# Patient Record
Sex: Female | Born: 1945 | Race: White | Hispanic: No
Health system: Midwestern US, Community
[De-identification: ages and names within clinical notes are randomized; demographics above are authoritative.]

## PROBLEM LIST (undated history)

## (undated) DIAGNOSIS — I2510 Atherosclerotic heart disease of native coronary artery without angina pectoris: Secondary | ICD-10-CM

## (undated) DIAGNOSIS — I6523 Occlusion and stenosis of bilateral carotid arteries: Secondary | ICD-10-CM

## (undated) DIAGNOSIS — E7800 Pure hypercholesterolemia, unspecified: Secondary | ICD-10-CM

---

## 2013-05-02 LAB — CBC WITH AUTOMATED DIFF
ABS. BASOPHILS: 0 10*3/uL (ref 0.0–0.1)
ABS. EOSINOPHILS: 0.1 10*3/uL (ref 0.0–0.7)
ABS. LYMPHOCYTES: 2 10*3/uL (ref 1.5–3.5)
ABS. MONOCYTES: 0.8 10*3/uL (ref 0.0–1.0)
ABS. NEUTROPHILS: 7.3 10*3/uL — ABNORMAL HIGH (ref 1.5–6.6)
BASOPHILS: 0 % (ref 0.0–3.0)
EOSINOPHILS: 1 % (ref 0.0–7.0)
HCT: 43.5 % (ref 36.0–47.0)
HGB: 14.2 g/dL (ref 12.0–16.0)
IMMATURE GRANULOCYTES: 0.3 % (ref 0.0–2.0)
LYMPHOCYTES: 19 % (ref 18.0–40.0)
MCH: 28.3 PG (ref 27.0–35.0)
MCHC: 32.6 g/dL (ref 30.7–37.3)
MCV: 86.7 FL (ref 81.0–94.0)
MONOCYTES: 8 % (ref 2.0–12.0)
MPV: 10.2 FL (ref 9.2–11.8)
NEUTROPHILS: 72 % (ref 48.0–72.0)
PLATELET: 207 10*3/uL (ref 130–400)
RBC: 5.02 M/uL (ref 4.20–5.40)
RDW: 14.1 % — ABNORMAL HIGH (ref 11.5–14.0)
WBC: 10.3 10*3/uL (ref 4.8–10.6)

## 2013-05-02 LAB — URINALYSIS W/ RFLX MICROSCOPIC
Bilirubin: NEGATIVE
Glucose: NEGATIVE mg/dL
Nitrites: NEGATIVE
Protein: NEGATIVE mg/dL
Specific gravity: 1.009 (ref 1.003–1.030)
Urobilinogen: 0.2 EU/dL (ref 0.2–1.0)
pH (UA): 7 (ref 4.6–8.0)

## 2013-05-02 LAB — METABOLIC PANEL, COMPREHENSIVE
A-G Ratio: 1.1 (ref 0.7–2.8)
ALT (SGPT): 31 U/L (ref 12–78)
AST (SGOT): 11 U/L — ABNORMAL LOW (ref 15–37)
Albumin: 3.6 g/dL (ref 3.5–4.7)
Alk. phosphatase: 90 U/L (ref 45–117)
Anion gap: 8 mmol/L — ABNORMAL LOW (ref 10–20)
BUN: 22 mg/dL — ABNORMAL HIGH (ref 7–18)
Bilirubin, total: 0.5 mg/dL (ref 0.2–1.0)
CO2: 33 mmol/L — ABNORMAL HIGH (ref 21–32)
Calcium: 9.2 mg/dL (ref 8.5–10.1)
Chloride: 101 mmol/L (ref 98–107)
Creatinine: 0.9 mg/dL (ref 0.6–1.3)
GFR est AA: >60 mL/min/{1.73_m2} (ref >60)
GFR est non-AA: >60 mL/min/{1.73_m2} (ref >60)
Globulin: 3.3 g/dL (ref 1.7–4.7)
Glucose: 98 mg/dL (ref 74–106)
Potassium: 3.4 mmol/L — ABNORMAL LOW (ref 3.5–5.1)
Protein, total: 6.9 g/dL (ref 6.4–8.2)
Sodium: 139 mmol/L (ref 136–145)

## 2013-05-02 LAB — LIPASE: Lipase: 167 U/L (ref 73–393)

## 2013-05-02 MED ORDER — KETOROLAC TROMETHAMINE 30 MG/ML INJECTION
30 mg/mL (1 mL) | INTRAMUSCULAR | Status: AC
Start: 2013-05-02 — End: 2013-05-02
  Administered 2013-05-02: 10:00:00 via INTRAVENOUS

## 2013-05-02 MED ORDER — MORPHINE 4 MG/ML SYRINGE
4 mg/mL | Freq: Once | INTRAMUSCULAR | Status: DC
Start: 2013-05-02 — End: 2013-05-02

## 2013-05-02 MED ORDER — KETOROLAC TROMETHAMINE 30 MG/ML INJECTION
30 mg/mL (1 mL) | INTRAMUSCULAR | Status: AC
Start: 2013-05-02 — End: 2013-05-02
  Administered 2013-05-02: 11:00:00 via INTRAVENOUS

## 2013-05-02 MED ORDER — ONDANSETRON (PF) 4 MG/2 ML INJECTION
4 mg/2 mL | Freq: Once | INTRAMUSCULAR | Status: AC
Start: 2013-05-02 — End: 2013-05-02
  Administered 2013-05-02: 10:00:00 via INTRAVENOUS

## 2013-05-02 MED ADMIN — sodium chloride 0.9 % bolus infusion 1,000 mL: INTRAVENOUS | @ 10:00:00 | NDC 00409798309

## 2013-05-02 NOTE — ED Notes (Signed)
Pt. Ambulatory from ED accompanied by spouse - pt. Provided with printed discharge instrucitions and prescriptions.  Pt. Walks with steady gait - NAD.

## 2013-05-02 NOTE — ED Notes (Signed)
Pt. C/o cont. Pain - PA made aware and ordered Morphine - pt. Refuse Morphine at this time.  Will notify PA.

## 2013-05-02 NOTE — ED Notes (Signed)
Pt. Resting quietly on stretcher w/ c/o R flank pain that began yesterday.

## 2013-05-02 NOTE — ED Notes (Signed)
C/o left sided abdominal and flank pain.  States pain started about 36 hours ago while she was in [place].  States was seen in ED there and had CT scan, but she states that they were unsure if she has a kidney stone or what.  States drove for the past 17 hours to get here to be evaluated.  States also has not had bowel movement in over 3 days as well.

## 2013-05-02 NOTE — ED Provider Notes (Addendum)
HPI Comments: 5:17 AM  Chief complaint: left flank pain, nausea  History obtained from patient and spouse  Anita Robbins is a 67 y.o. female who presents to the Emergency Dept with c/o left flank pain for past 3 days. [place] and was seen in an ED there, where she was evaluated, but no cause was found for her pain. She had CT abd/pelv with IV contrast and labs at that time - was discharged with rx percocet. Pt drove back up to [place] and continues to have pain located in left flank region, radiating to left groin region. C/o N/V that only occurred after she took the percocet. Has not had a BM in 3 days. Denies c/o fever, chills, cough, congestion, vaginal discharge, dysuria. Wants to know what is causing her pain.     PCP: Lise Auer, MD   No LMP recorded. Patient is postmenopausal.        History reviewed. No pertinent past medical history.     History reviewed. No pertinent past surgical history.      History reviewed. No pertinent family history.     History     Social History   ??? Marital Status: WIDOWED     Spouse Name: N/A     Number of Children: N/A   ??? Years of Education: N/A     Occupational History   ??? Not on file.     Social History Main Topics   ??? Smoking status: Never Smoker    ??? Smokeless tobacco: Not on file   ??? Alcohol Use: Not on file   ??? Drug Use: Not on file   ??? Sexually Active: Not on file     Other Topics Concern   ??? Not on file     Social History Narrative   ??? No narrative on file          ALLERGIES: Codeine      Review of Systems   Constitutional: Negative for fever and chills.   HENT: Negative for neck pain and neck stiffness.    Respiratory: Negative for cough and shortness of breath.    Cardiovascular: Negative for chest pain.   Gastrointestinal: Positive for abdominal pain and constipation. Negative for diarrhea.   Genitourinary: Positive for flank pain. Negative for dysuria, frequency and vaginal discharge.   Musculoskeletal: Negative for back  pain.   Skin: Negative for rash.   Neurological: Negative for dizziness and headaches.       Filed Vitals:    05/02/13 0301   BP: 174/105   Temp: 98.2 ??F (36.8 ??C)   Resp: 20   Height: 5\' 6"  (1.676 m)   Weight: 72.576 kg (160 lb)   SpO2: 96%            Physical Exam   Nursing note and vitals reviewed.  Constitutional: She is oriented to person, place, and time. Vital signs are normal. She appears well-developed and well-nourished.  Non-toxic appearance. No distress.   HENT:   Head: Normocephalic and atraumatic.   Mouth/Throat: Oropharynx is clear and moist and mucous membranes are normal.   Eyes: Conjunctivae are normal.   Neck: Normal range of motion. Neck supple.   Cardiovascular: Normal rate and regular rhythm.    Pulmonary/Chest: Effort normal and breath sounds normal.   Abdominal: Soft. She exhibits no distension. There is tenderness (mild) in the left lower quadrant. There is CVA tenderness (mild, left side). There is no rebound and no  guarding.   Neurological: She is alert and oriented to person, place, and time. GCS eye subscore is 4. GCS verbal subscore is 5. GCS motor subscore is 6.   Skin: Skin is warm and dry. She is not diaphoretic.   Psychiatric:   Calm and cooperative         MDM     Amount and/or Complexity of Data Reviewed:   Clinical lab tests:  Ordered and reviewed  Tests in the radiology section of CPT??:  Ordered and reviewed   Obtain history from someone other than the patient:  Yes   Review and summarize past medical records:  Yes   Discuss the patient with another provider:  Yes   Independant visualization of image, tracing, or specimen:  Yes      Procedures      Labs:  Recent Results (from the past 12 hour(s))   METABOLIC PANEL, COMPREHENSIVE    Collection Time     05/02/13  5:20 AM       Result Value Range    Sodium 139  136 - 145 mmol/L    Potassium 3.4 (*) 3.5 - 5.1 mmol/L    Chloride 101  98 - 107 mmol/L    CO2 33 (*) 21 - 32 mmol/L    Anion gap 8 (*) 10 - 20 mmol/L    Glucose 98  74 -  106 mg/dL    BUN 22 (*) 7 - 18 mg/dL    Creatinine 0.9  0.6 - 1.3 mg/dL    GFR est AA >16  >10 ml/min/1.15m2    GFR est non-AA >60  >60 ml/min/1.73m2    Calcium 9.2  8.5 - 10.1 mg/dL    Bilirubin, total 0.5  0.2 - 1.0 mg/dL    ALT 31  12 - 78 U/L    AST 11 (*) 15 - 37 U/L    Alk. phosphatase 90  45 - 117 U/L    Protein, total 6.9  6.4 - 8.2 g/dL    Albumin 3.6  3.5 - 4.7 g/dL    Globulin 3.3  1.7 - 4.7 g/dL    A-G Ratio 1.1  0.7 - 2.8     LIPASE    Collection Time     05/02/13  5:20 AM       Result Value Range    Lipase 167  73 - 393 U/L   CBC WITH AUTOMATED DIFF    Collection Time     05/02/13  5:20 AM       Result Value Range    WBC 10.3  4.8 - 10.6 K/uL    RBC 5.02  4.20 - 5.40 M/uL    HGB 14.2  12.0 - 16.0 g/dL    HCT 96.0  45.4 - 09.8 %    MCV 86.7  81.0 - 94.0 FL    MCH 28.3  27.0 - 35.0 PG    MCHC 32.6  30.7 - 37.3 g/dL    RDW 11.9 (*) 14.7 - 14.0 %    PLATELET 207  130 - 400 K/uL    MPV 10.2  9.2 - 11.8 FL    NEUTROPHILS 72  48.0 - 72.0 %    LYMPHOCYTES 19  18.0 - 40.0 %    MONOCYTES 8  2.0 - 12.0 %    EOSINOPHILS 1  0.0 - 7.0 %    BASOPHILS 0  0.0 - 3.0 %    ABS. NEUTROPHILS 7.3 (*) 1.5 - 6.6 K/UL  ABS. LYMPHOCYTES 2.0  1.5 - 3.5 K/UL    ABS. MONOCYTES 0.8  0.0 - 1.0 K/UL    ABS. EOSINOPHILS 0.1  0.0 - 0.7 K/UL    ABS. BASOPHILS 0.0  0.0 - 0.1 K/UL    DF AUTOMATED      IMMATURE GRANULOCYTES 0.3  0.0 - 2.0 %   URINALYSIS W/ RFLX MICROSCOPIC    Collection Time     05/02/13  6:52 AM       Result Value Range    Color YELLOW  YEL      Appearance CLEAR  CLEAR      Specific gravity 1.009  1.003 - 1.030      pH (UA) 7.0  4.6 - 8.0      Protein NEGATIVE   NEG mg/dL    Glucose NEGATIVE   NEG mg/dL    Ketone TRACE (*) NEG mg/dL    Bilirubin NEGATIVE   NEG      Blood TRACE (*) NEG      Urobilinogen 0.2  0.2 - 1.0 EU/dL    Nitrites NEGATIVE   NEG      Leukocyte Esterase TRACE (*) NEG      WBC 0-3  0 - 5 /hpf    RBC 0-3  0 - 3 /hpf    Epithelial cells 0-3  0 - 10 /hpf    Bacteria NONE  NONE /hpf    Casts NONE  NONE  /lpf    Crystals NONE  NONE /LPF         Radiology:  Referring Physician: Norwood Levo [place]  Patient Name: [name]  THIS IS A PRELIMINARYREPORT FROM IMAGING ON CALL   EXAM: CT abdomen and pelvis without contrast   FINDINGS:   Lung bases are clear. The visualized cardiac chambers are normal size and   configuration. Gallbladder is distended and contains cholesterol stones but is   not inflamed. 2.4 cm benign right adrenal adenoma is noted. Subtle curvilinear   density lower pole left kidney likely this is a partially calcified cyst which   can be followed up with enhanced CT as clinically warranted. Normal liver,   pancreas, spleen, left adrenal gland and right kidney. The stomach and abdominal   small and large bowel are normal. There is no aortic aneurysm. There is no   significant retroperitoneal lymphadenopathy. Small fat-containing umbilical   hernia is noted.  The pelvic small and large bowel are notable only for mild diverticulosis. There   is no evidence of appendicitis. The uterus and adnexal structures are normal.   Urinary bladder is unremarkable. There is no pelvic free fluid. No discrete   pelvic lymphadenopathy is identified.   IMPRESSION:   Gallstones without gallbladder inflammation.  Right adrenal adenoma.  Partially calcified left renal cyst may be followed up with enhanced CT as   clinically warranted.  No definite evidence of acute pathology.  THIS DOCUMENT HAS BEEN ELECTRONICALLY SIGNED  Hyman Bible, MD  05/02/2013 05:53 EST     <EMERGENCY DEPARTMENT CASE SUMMARY>    Impression/Differential Diagnosis: kidney stone, pyelonephritis, flank pain, constipation    Plan: CT abd/pelv (-), labs, UA, zofran, toradol    ED Course: Pt is a well appearing 67 y/o female who presents to the ED c/o left flank pain for past 3 days. Workup at previous hospital did not reveal cause for acute pain. Reviewed reports of imaging and labs that pt provided from previous hospital. Decision was made to repeat  CT abd/pelv  without contrast to see if any small ureteral calculi were present to account for patient's pain (previous CT was performed with IV contrast). UA and labs reviewed. CT abd/pelv interpreted as negative for acute pathology. Several incidental findings noted, which were discussed with pt and spouse, including diverticulosis, right adrenal adenoma, gallstones, and left renal calcified cyst. Pt stable for discharge, to follow up with PCP. Advised to try magnesium citrate and miralax in an attempt to relieve constipation. No emesis while in the ED - pt was provided with Rx zofran for home.     D/w patient and spouse test results, dx, tx plan, and need for follow-up. Pt advised to RTER for new or worsening symptoms. The patient and spouse verbalized understanding. All questions answered, and the patient and spouse agrees with the plan.    Final Impression/Diagnosis:   1. Left flank pain    2. Constipation    3. Renal cyst     Patient condition at time of disposition:  stable     I have reviewed the following home medications:    Prior to Admission medications    Medication Sig Start Date End Date Taking? Authorizing Provider   oxyCODONE-acetaminophen (PERCOCET) 5-325 mg per tablet Take 1 tablet by mouth every four (4) hours as needed for Pain.   Yes Phys Other, MD       Marena Chancy, NP    I was personally available for consultation in the emergency department.  I have reviewed the chart and the documentation recorded by the [facility], including the assessment, treatment plan, and disposition.  Jacklynn Barnacle, MD

## 2013-05-03 LAB — CULTURE, URINE
Culture result:: NO GROWTH
Culture: NO GROWTH

## 2013-05-03 NOTE — Progress Notes (Signed)
Quick Note:    Pt was made aware of incidental findings (renal cyst, adrenal adenoma, gallstone) prior to discharge and instructed to follow up with PCP.  ______

## 2015-07-09 ENCOUNTER — Ambulatory Visit
Admit: 2015-07-09 | Discharge: 2015-07-09 | Payer: MEDICARE | Attending: Cardiovascular Disease | Primary: Internal Medicine

## 2015-07-09 DIAGNOSIS — I10 Essential (primary) hypertension: Secondary | ICD-10-CM

## 2015-07-09 MED ORDER — VALSARTAN 80 MG TAB
80 mg | ORAL_TABLET | Freq: Every day | ORAL | 1 refills | Status: DC
Start: 2015-07-09 — End: 2015-09-04

## 2015-07-09 NOTE — Patient Instructions (Signed)
MyChart Activation    Thank you for requesting access to MyChart. Please follow the instructions below to securely access and download your online medical record. MyChart allows you to send messages to your doctor, view your test results, renew your prescriptions, schedule appointments, and more.    How Do I Sign Up?    1. In your internet browser, go to [email or web address]  2. Click on the First Time User? Click Here link in the Sign In box. You will be redirect to the New Member Sign Up page.  3. Enter your MyChart Access Code exactly as it appears below. You will not need to use this code after you???ve completed the sign-up process. If you do not sign up before the expiration date, you must request a new code.    MyChart Access Code: [email or web address]  Expires: 10/07/2015  2:06 PM (This is the date your MyChart access code will expire)    4. Enter the last four digits of your Social Security Number (xxxx) and Date of Birth (mm/dd/yyyy) as indicated and click Submit. You will be taken to the next sign-up page.  5. Create a MyChart ID. This will be your MyChart login ID and cannot be changed, so think of one that is secure and easy to remember.  6. Create a MyChart password. You can change your password at any time.  7. Enter your Password Reset Question and Answer. This can be used at a later time if you forget your password.   8. Enter your e-mail address. You will receive e-mail notification when new information is available in MyChart.  9. Click Sign Up. You can now view and download portions of your medical record.  10. Click the Download Summary menu link to download a portable copy of your medical information.    Additional Information    If you have questions, please visit the Frequently Asked Questions section of the MyChart website at [email or web address] Remember, MyChart is NOT to be used for urgent needs. For medical emergencies, dial 911.

## 2015-07-09 NOTE — Progress Notes (Signed)
07/09/2015    Georg Ruddle, MD    RE: [name]     Dear Georg Ruddle, MD    I had the pleasure of seeing your patient Anita Robbins in the office today. Although you are familiar with her past medical history, I shall review it here for my records.    HISTORY OF PRESENT ILLNESS: Anita Robbins is a 70 y.o. female who comes to the office today for had concerns including Heart Problem. patient is referred for evaluation of elevated blood pressure, noted to be erratic as per the patient for many years (but not on any therapy at this time).  She also had recent extensive workup done by her PMD which included an MRI of abdomen 06/10/15 which showed a stable 2.4 cm right adrenal adenoma.  CT chest was also completed 07/01/15 (2 workup 50-pack-year history of smoking) and did confirm a 1.7 cm right upper lobe groundglass nodule.  Despite the above findings she is presently asymptomatic, with no complaints of chest pain shortness of breath palpitations vomiting fever chills headache syncope GI bleed or hematuria.  Baseline 2+ orthopnea, no PND. She has had no previous stress test or echocardiogram performed.  Of note, patient has office visit with Dr Arman Bogus later this week to evaluate the right lung nodule seen on CT chest.      CURRENT MEDICATIONS:    Current Outpatient Prescriptions   Medication Sig   ??? omeprazole (PRILOSEC) 20 mg capsule Take 20 mg by mouth daily.   ??? cholecalciferol, vitamin D3, (VITAMIN D3) 2,000 unit tab Take 2,000 Int'l Units by mouth daily.     No current facility-administered medications for this visit.         ALLERGIES: Patient is allergic to codeine.    REVIEW OF SYSTEM:   Review of Systems   Constitutional: Negative for chills and fever.   Eyes: Negative for double vision.   Respiratory: Negative for cough, hemoptysis and shortness of breath.    Cardiovascular: Negative for chest pain, palpitations, leg swelling and PND.    Gastrointestinal: Negative for blood in stool, melena, nausea and vomiting.   Genitourinary: Negative for hematuria.   Musculoskeletal: Negative for joint pain.   Skin: Negative for rash.   Neurological: Negative for seizures, loss of consciousness and headaches.   Endo/Heme/Allergies: Does not bruise/bleed easily.         FAMILY HISTORY: Patient family history is negative for Coronary Artery Disease and Stroke.    SOCIAL HISTORY: Patient  reports that she quit smoking about 7 years ago. She has a 50.00 pack-year smoking history. She has never used smokeless tobacco.    Surgical History:  Patient   Past Surgical History:   Procedure Laterality Date   ??? HX BREAST LUMPECTOMY Left    ??? HX OVARIAN CYST REMOVAL Left         PHYSICAL EXAMINATION: Patient  height is  (1.676 m) and weight is 172 lb (78 kg). Her blood pressure is 180/100 (abnormal) and her pulse is 88. Her oxygen saturation is 95%.    BP 190/100    Physical Exam   Constitutional: She is well-developed, well-nourished, and in no distress.   HENT:   Head: Normocephalic and atraumatic.   Eyes: No scleral icterus.   Neck: No JVD present. Carotid bruit is present. No thyromegaly present.       Cardiovascular: Normal rate, regular rhythm and intact distal pulses.  Exam reveals no gallop and no friction rub.  Murmur heard.  Systolic ejection murmur at right upper sternal border, no S3 or S4   Pulmonary/Chest: Breath sounds normal. She has no wheezes. She has no rales.   Abdominal: Soft. Bowel sounds are normal. She exhibits no distension. There is no tenderness.   Musculoskeletal: She exhibits no edema.   Neurological: She is alert. No cranial nerve deficit.   Skin: Skin is warm and dry.                         LABS & TESTING:   EKG: Sinus  Rhythm 80/min  WITHIN NORMAL LIMITS    Labs 06/20/15 = blood sugar 107 electrolytes CBC and TFTs WNL.  TC 182 TG 167 HDL 49 LDL 100.    Patient Active Problem List    Diagnosis   ??? Gallstones      Seen on CT chest 07/01/15 and MRI abdomen 06/10/15     ??? Nodule of right lung     CT chest 07/01/15 = 1.7 cm groundglass right upper lobe nodule, 3 mm right upper lobe subpleural nodule, mild paraseptal emphysematous change, gallstones in a contracted gallbladder  [place]     ??? COPD (chronic obstructive pulmonary disease) (HCC)     CT chest 07/01/15 = mild paraseptal emphysematous change  PFTs 06/27/15 = normal spirometry     ??? ASHD (arteriosclerotic heart disease)     CT chest 07/01/15 = mild coronary artery calcification     ??? Barrett's esophagus     Followed by Dr Delfino LovettVipul Shah     ??? Shingles   ??? Adrenal adenoma     MRI abdomen 06/10/15 = stable 2.4 cm right adrenal adenoma (compared to study 2016), fatty liver, several prominent gallstones within the gallbladder, stable pancreatic cystic lesions     ??? Hypertension        ASSESSMENT AND PLAN:    1. Patient presents to the office for baseline cardiac evaluation given risk factors above and coronary calcification seen on CT chest 2/17. Recommend cardiac workup despite present asymptomatic status (note: may also need eventual thoracic surgery for 1.7 cm RUL nodule).  2. Continue same meds, add ASA 81 mg daily (will hold if surgery required). Recommend strict low salt/chol diet.  3. Start Diovan 80 mg daily for elevated BP response.  4. Schedule echo, carotid US and EST MIBI (evaluate CAD/bruits/murmur).  5. Thoracic evaluation with Dr [place]an [place] later this week.  6. Routine cardiac follow up 1 month. Will clear for lung nodule resection as low risk pending completion of above testing.    Orders Placed This Encounter   ??? AMB POC EKG ROUTINE W/ 12 LEADS, INTER & REP         I discussed the patient's  BMI with her.  I have recommended the following interventions: dietary management education, guidance, and counseling .     The patient was counseled on the dangers of tobacco use.      I attest that current meds have been reviewed and are accurate     Thank you for the courtesy of this consultation.    Sincerely yours,    Alessandra BevelsLee P Luevenia Mcavoy, MD

## 2015-07-15 ENCOUNTER — Encounter: Primary: Internal Medicine

## 2015-07-17 ENCOUNTER — Encounter: Payer: MEDICARE | Primary: Internal Medicine

## 2015-07-18 ENCOUNTER — Encounter: Payer: MEDICARE | Primary: Internal Medicine

## 2015-07-18 ENCOUNTER — Institutional Professional Consult (permissible substitution): Admit: 2015-07-18 | Discharge: 2015-07-18 | Payer: MEDICARE | Primary: Internal Medicine

## 2015-07-18 DIAGNOSIS — I10 Essential (primary) hypertension: Secondary | ICD-10-CM

## 2015-07-18 NOTE — Progress Notes (Signed)
??   BP stable on Diovan, continue present therapy

## 2015-07-18 NOTE — Patient Instructions (Signed)
BP stable on Diovan, continue present therapy.  Spoke with patient

## 2015-07-22 ENCOUNTER — Encounter: Payer: MEDICARE | Primary: Internal Medicine

## 2015-07-25 NOTE — Progress Notes (Signed)
Discussed with patient results of carotid ultrasound showed mild to moderate plaque with no significant stenosis.  Continue aspirin.  LDL goal less than 100. currently 100 mg/dL.  Echocardiogram shows mild AI with normal LV function.  Continue present care with repeat studies 1-2 years.

## 2015-08-20 ENCOUNTER — Encounter: Payer: MEDICARE | Primary: Internal Medicine

## 2015-09-04 ENCOUNTER — Ambulatory Visit
Admit: 2015-09-04 | Discharge: 2015-09-04 | Payer: MEDICARE | Attending: Cardiovascular Disease | Primary: Internal Medicine

## 2015-09-04 DIAGNOSIS — I10 Essential (primary) hypertension: Secondary | ICD-10-CM

## 2015-09-04 MED ORDER — VALSARTAN 160 MG TAB
160 mg | ORAL_TABLET | Freq: Every day | ORAL | 3 refills | Status: DC
Start: 2015-09-04 — End: 2016-09-07

## 2015-09-04 NOTE — Progress Notes (Signed)
09/04/2015    CHIEF COMPLAINT: had concerns including Heart Problem.   Subjective:   HISTORY OF PRESENT ILLNESS: Anita Robbins is a 70 y.o.female who presents for cardiology follow up.  Patient is doing very well, with no complaints of chest pain shortness of breath palpitations vomiting fever chills headache syncope GI bleed or hematuria. Baseline 1+ orthopnea, no PND.  Baseline cardiac testing includes echo 07/18/15 which shows RVSP 30 mmHg, mild AI, normal LV function.  EST MIBI 08/20/15 confirms no ischemia with EF 74%.  Carotid 07/22/15 = mild to moderate plaque both carotid bulbs without stenosis.  The patient is also waiting a PET scan later this month to reevaluate the lung nodule seen on prior CT chest 2/17.  Of note, patient did have an elevated blood pressure of 220/100 at peak exercise during her stress test.      Allergies   Allergen Reactions   ??? Codeine Nausea Only     Current Outpatient Prescriptions   Medication Sig   ??? omeprazole (PRILOSEC) 20 mg capsule Take 20 mg by mouth daily.   ??? cholecalciferol, vitamin D3, (VITAMIN D3) 2,000 unit tab Take 2,000 Int'l Units by mouth daily.   ??? valsartan (DIOVAN) 80 mg tablet Take 1 Tab by mouth daily.   ??? aspirin delayed-release 81 mg tablet Take 81 mg by mouth daily.     No current facility-administered medications for this visit.      Past Medical History:   Diagnosis Date   ??? Adrenal adenoma    ??? Barrett's esophagus    ??? Hypertension    ??? Shingles      Past Surgical History:   Procedure Laterality Date   ??? HX BREAST LUMPECTOMY Left    ??? HX OVARIAN CYST REMOVAL Left      Social History     Social History   ??? Marital status: WIDOWED     Spouse name: N/A   ??? Number of children: N/A   ??? Years of education: N/A     Social History Main Topics   ??? Smoking status: Former Smoker     Packs/day: 1.00     Years: 50.00     Quit date: 05/04/2008   ??? Smokeless tobacco: Never Used   ??? Alcohol use Not on file      Comment: social wine   ??? Drug use: Not on file    ??? Sexual activity: Not on file     Other Topics Concern   ??? Not on file     Social History Narrative     Family History   Problem Relation Age of Onset   ??? Coronary Artery Disease Neg Hx    ??? Stroke Neg Hx         REVIEW OF SYSTEMS:   Review of Systems   Constitutional: Negative for chills and fever.   Eyes: Negative for double vision.   Respiratory: Negative for shortness of breath.    Cardiovascular: Negative for chest pain, palpitations, leg swelling and PND.   Gastrointestinal: Negative for blood in stool, melena, nausea and vomiting.   Genitourinary: Negative for hematuria.   Musculoskeletal: Negative for joint pain.   Skin: Negative for rash.   Neurological: Negative for seizures, loss of consciousness and headaches.   Endo/Heme/Allergies: Does not bruise/bleed easily.         Objective:   PHYSICAL EXAM:  weight is 178 lb (80.7 kg). Her blood pressure is 160/90 and her pulse is 91. Her oxygen saturation  is 96%.  Body mass index is 28.73 kg/(m^2).   Physical Exam   Constitutional: She is well-developed, well-nourished, and in no distress.   HENT:   Head: Normocephalic and atraumatic.   Eyes: No scleral icterus.   Neck: No JVD present. Carotid bruit is present. No thyromegaly present.       Cardiovascular: Normal rate, regular rhythm and intact distal pulses.  Exam reveals no gallop and no friction rub.    Murmur heard.  Soft systolic ejection murmur right upper sternal border, no S3 or S4   Pulmonary/Chest: Breath sounds normal. She has no wheezes. She has no rales.   Abdominal: Soft. Bowel sounds are normal. She exhibits no distension. There is no tenderness.   Musculoskeletal: She exhibits no edema.   Neurological: She is alert. No cranial nerve deficit.   Skin: Skin is warm and dry.       LABS AND TESTING:       Patient Active Problem List    Diagnosis   ??? Aortic insufficiency     07/18/15 = RVSP 30 mmHg, mild AI, trace MR, normal LV function     ??? Carotid artery plaque      Carotids 07/22/15 = mild to moderate plaque both carotid bulbs, no stenosis     ??? Gallstones     Seen on CT chest 07/01/15 and MRI abdomen 06/10/15     ??? Nodule of right lung     CT chest 07/01/15 = 1.7 cm groundglass right upper lobe nodule, 3 mm right upper lobe subpleural nodule, mild paraseptal emphysematous change, gallstones in a contracted gallbladder  Being followed by Dr Arman Bogus     ??? COPD (chronic obstructive pulmonary disease) (HCC)     CT chest 07/01/15 = mild paraseptal emphysematous change  PFTs 06/27/15 = normal spirometry     ??? ASHD (arteriosclerotic heart disease)     CT chest 07/01/15 = mild coronary artery calcification  EST MIBI 08/20/15 = no ischemia, EF 74%     ??? Barrett's esophagus     Followed by Dr Delfino Lovett     ??? Shingles   ??? Adrenal adenoma     MRI abdomen 06/10/15 = stable 2.4 cm right adrenal adenoma (compared to study 2016), fatty liver, several prominent gallstones within the gallbladder, stable pancreatic cystic lesions  Followed by Dr Lorna Dibble     ??? Hypertension         Assessment/Plan:     1. Recommend increasing Diovan 160 mg daily given elevated BP response. Told to resume ASA 81 mg.  2. Continue other meds, maintain low salt/chol diet.  3. Will repeat echo and carotid in 1 year (re-assess AI/plaque).  4. PET scan as per Dr Arman Bogus to re-assess lung nodules.  5. Routine cardiac follow up 6 months. Patient to take BP with home machine, will call office if elevated BP persists despite increased Diovan dose.    No orders of the defined types were placed in this encounter.      Follow-up Disposition: Not on File    I discussed the patient's  BMI with her.  I have recommended the following interventions: dietary management education, guidance, and counseling .     The patient was counseled on the dangers of tobacco use.      I attest that current meds have been reviewed and are accurate    Alessandra Bevels, MD

## 2015-10-21 NOTE — Progress Notes (Signed)
Discussed with patient today.  Patient followed [facility] for lung lung nodule.  Patient contemplating resection next 1-2 months.

## 2015-10-21 NOTE — Progress Notes (Signed)
Left message for patient to call us back regarding PET/CT scan results.

## 2015-11-29 ENCOUNTER — Ambulatory Visit: Admit: 2015-11-29 | Discharge: 2015-11-29 | Payer: MEDICARE | Attending: Internal Medicine | Primary: Internal Medicine

## 2015-11-29 DIAGNOSIS — J449 Chronic obstructive pulmonary disease, unspecified: Secondary | ICD-10-CM

## 2015-11-29 NOTE — Progress Notes (Signed)
Initial Patient Consult    Name: Anita Robbins     DOB: August 20, 1945     Date: 11/30/2015        Subjective:     This patient has been seen and evaluated at the request of Dr. Louanna Raw for pre operative PFT.   Patient is a 70 y.o. female , ex smoker (50 pack years, quit 9 years ago) was found to have 1.7 cm nodule on right lung on surveillance CT chest. She is scheduled to undergo excision of the nodule at [facility] with Dr. Vilma Prader. She needs pre operative PFT. She denies cough or wheezing or chest tightness. She has exertional shortness of breath. She is not on any inhaler or nebulizer at this time.      Past Medical History:   Diagnosis Date   ??? Adrenal adenoma    ??? Barrett's esophagus    ??? Hypertension    ??? Shingles       Past Surgical History:   Procedure Laterality Date   ??? HX BREAST LUMPECTOMY Left    ??? HX OVARIAN CYST REMOVAL Left         Current Outpatient Prescriptions:   ???  valsartan (DIOVAN) 160 mg tablet, Take 1 Tab by mouth daily., Disp: 90 Tab, Rfl: 3  ???  aspirin delayed-release 81 mg tablet, Take 81 mg by mouth daily., Disp: , Rfl:   ???  omeprazole (PRILOSEC) 20 mg capsule, Take 20 mg by mouth daily., Disp: , Rfl:   ???  cholecalciferol, vitamin D3, (VITAMIN D3) 2,000 unit tab, Take 2,000 Int'l Units by mouth daily., Disp: , Rfl:   Allergies   Allergen Reactions   ??? Codeine Nausea Only      Social History   Substance Use Topics   ??? Smoking status: Former Smoker     Packs/day: 1.00     Years: 50.00     Quit date: 05/04/2008   ??? Smokeless tobacco: Never Used   ??? Alcohol use Not on file      Comment: social wine      Family History   Problem Relation Age of Onset   ??? Coronary Artery Disease Neg Hx    ??? Stroke Neg Hx             Review of Systems:  Ears, nose, mouth, throat, and face: negative  Respiratory: positive for dyspnea on exertion  Cardiovascular: negative  Gastrointestinal: negative  Musculoskeletal:negative  Neurological: negative  Constitutional: negative  Genitourinary:negative  Sleep Apnea  none         Behavl/Psych: anxiety  Dermatology: normal      Objective:   Vital Signs:    Visit Vitals   ??? BP 150/83   ??? Pulse 78   ??? Resp 16   ??? Ht 5\' 5"  (1.651 m)   ??? Wt 173 lb (78.5 kg)   ??? SpO2 96%   ??? BMI 28.79 kg/m2                      Physical Exam:   General appearance: alert, cooperative, no distress  Throat: Lips, mucosa, and tongue normal. Teeth and gums normal  Neck: supple, symmetrical, trachea midline and no adenopathy  Lungs: clear to auscultation bilaterally  Heart: regular rate and rhythm, S1, S2 normal, no murmur, click, rub or gallop  Abdomen: soft, non-tender. Bowel sounds normal. No masses,  no organomegaly  Extremities: extremities normal, atraumatic, no cyanosis or edema  Pulses: 2+ and symmetric  Skin: Skin color, texture, turgor normal. No rashes or lesions  Lymph nodes: Cervical, supraclavicular, and axillary nodes normal.  Neurologic: Grossly normal    Data review:   Labs:        XR Results (maximum last 3):  No results found for this or any previous visit.    CT Results (maximum last 3):        [place] Results (maximum last 3):  No results found for this or any previous visit.      PET Results (maximum last 3):    Results from Abstract encounter on 10/07/15   PET/CT TUMOR IMAGE SKULL THIGH W (INI)    PROCEDURES: Full PFT - FVC 80%  FEV1 86%  DLCO 93%. No significant change after bronchodilator             Assessment  Patient Active Problem List   Diagnosis Code   ??? Barrett's esophagus K22.70   ??? Shingles B02.9   ??? Adrenal adenoma D35.00   ??? Hypertension I10   ??? Gallstones K80.20   ??? Nodule of right lung R91.1   ??? COPD (chronic obstructive pulmonary disease) (HCC) J44.9   ??? ASHD (arteriosclerotic heart disease) I25.10   ??? Aortic insufficiency I35.1   ??? Carotid artery plaque I65.29       1. Chronic obstructive pulmonary disease, unspecified COPD type (HCC)    2. Nodule of right lung      Plan: Pulmonary wise she is stable for excision of the lung nodule under Anesthesia   Her PFT is good. She should be able to tolerate Lobectomy if indicated  To use nebulizer and incentive spirometry after surgery  Follow up as needed       Thank you for the kind referral.    Total Time Spent with patient: 50 minutes  50% of the time was spent in direct patient care and in discussion with the patient/family.       Follow-up Disposition: Not on File       Juliette Mangle, MD

## 2015-12-13 ENCOUNTER — Encounter: Payer: MEDICARE | Attending: Internal Medicine | Primary: Internal Medicine

## 2016-03-18 ENCOUNTER — Ambulatory Visit
Admit: 2016-03-18 | Discharge: 2016-03-18 | Payer: MEDICARE | Attending: Cardiovascular Disease | Primary: Internal Medicine

## 2016-03-18 DIAGNOSIS — I2510 Atherosclerotic heart disease of native coronary artery without angina pectoris: Secondary | ICD-10-CM

## 2016-03-18 NOTE — Progress Notes (Signed)
03/18/2016    CHIEF COMPLAINT: had concerns including Heart Problem.   Subjective:   HISTORY OF PRESENT ILLNESS: Anita Robbins is a 70 y.o.female who presents for cardiology follow up.  Patient underwent PET/CT 10/07/15 which showed 1.6 cm right upper lobe groundglass nodule which was not hypermetabolic, previous 3 mm nodule in right upper lobe was not seen recommendation is to do a repeat; study in 6 months to reevaluate.  She is doing well at this time, with no complaints of chest pain shortness of breath palpitations vomiting fever chills headache syncope GI bleed or hematuria.  She has baseline 2 pillow orthopnea with no PND.      Allergies   Allergen Reactions   ??? Codeine Nausea Only     Current Outpatient Prescriptions   Medication Sig   ??? valsartan (DIOVAN) 160 mg tablet Take 1 Tab by mouth daily.   ??? aspirin delayed-release 81 mg tablet Take 81 mg by mouth daily.   ??? omeprazole (PRILOSEC) 20 mg capsule Take 20 mg by mouth daily.   ??? cholecalciferol, vitamin D3, (VITAMIN D3) 2,000 unit tab Take 2,000 Int'l Units by mouth daily.     No current facility-administered medications for this visit.      Past Medical History:   Diagnosis Date   ??? Adrenal adenoma    ??? Barrett's esophagus    ??? Hypertension    ??? Shingles      Past Surgical History:   Procedure Laterality Date   ??? HX BREAST LUMPECTOMY Left    ??? HX OVARIAN CYST REMOVAL Left      Social History     Social History   ??? Marital status: WIDOWED     Spouse name: N/A   ??? Number of children: N/A   ??? Years of education: N/A     Social History Main Topics   ??? Smoking status: Former Smoker     Packs/day: 1.00     Years: 50.00     Quit date: 05/04/2008   ??? Smokeless tobacco: Never Used   ??? Alcohol use Not on file      Comment: social wine   ??? Drug use: Not on file   ??? Sexual activity: Not on file     Other Topics Concern   ??? Not on file     Social History Narrative     Family History   Problem Relation Age of Onset   ??? Coronary Artery Disease Neg Hx     ??? Stroke Neg Hx         REVIEW OF SYSTEMS:   Review of Systems   Constitutional: Negative for chills and fever.   Eyes: Negative for double vision.   Respiratory: Negative for shortness of breath.    Cardiovascular: Negative for chest pain, palpitations, leg swelling and PND.   Gastrointestinal: Negative for blood in stool, melena, nausea and vomiting.   Genitourinary: Negative for hematuria.   Musculoskeletal: Negative for joint pain.   Skin: Negative for rash.   Neurological: Negative for seizures, loss of consciousness and headaches.   Endo/Heme/Allergies: Does not bruise/bleed easily.         Objective:   PHYSICAL EXAM:  height is 5\' 5"  (1.651 m) and weight is 176 lb (79.8 kg). Her blood pressure is 150/70 and her pulse is 64. Her oxygen saturation is 98%.  Body mass index is 29.29 kg/(m^2).   Physical Exam   Constitutional: She is well-developed, well-nourished, and in no distress.   HENT:  Head: Normocephalic and atraumatic.   Eyes: No scleral icterus.   Neck: No JVD present. Carotid bruit is not present. No thyromegaly present.   Cardiovascular: Normal rate, regular rhythm and intact distal pulses.  Exam reveals no gallop and no friction rub.    Murmur heard.  Systolic ejection murmur at right upper sternal border, no S3 or S4   Pulmonary/Chest: Breath sounds normal. She has no wheezes. She has no rales.   Abdominal: Soft. Bowel sounds are normal. She exhibits no distension. There is no tenderness.   Musculoskeletal: She exhibits no edema.   Neurological: She is alert. No cranial nerve deficit.   Skin: Skin is warm and dry.       LABS AND TESTING:   EKG: Sinus Rhythm 75/min, early transition  WITHIN NORMAL LIMITS  No change compared to prior EKG 07/2015    Labs 12/2015 = blood sugar 103.  Electrolytes CBC and LFTs WNL.  BNP 19 (0-100).    Patient Active Problem List    Diagnosis   ??? Right thyroid nodule     PET/CT 10/07/15 = 9 mm right thyroid nodule showing hypermetabolism  Followed by Dr Lorna DibbleK Shreedhar      ??? Aortic insufficiency     Echo 07/18/15 = RVSP 30 mmHg, mild AI, trace MR, normal LV function     ??? Carotid artery plaque     Carotid US 07/22/15 = mild to moderate plaque both carotid bulbs, no stenosis     ??? Gallstones     Seen on PET/CT 10/07/15, CT chest 07/01/15 and MRI abdomen 06/10/15     ??? Nodule of right lung     PET/CT 10/07/15 = 1.6 cm right upper lobe groundglass nodule which is not hypermetabolic, 3 mm nodule in the right upper lobe not seen on this study  CT chest 07/01/15 = 1.7 cm groundglass right upper lobe nodule, 3 mm right upper lobe subpleural nodule, mild paraseptal emphysematous change, gallstones in a contracted gallbladder  Followed by Dr Vilma PraderIsbell ([facility])     ??? COPD (chronic obstructive pulmonary disease) (HCC)     CT chest 07/01/15 = mild paraseptal emphysematous change  PFTs 06/27/15 = normal spirometry  Followed by Dr Bufford ButtnerMehta     ??? ASHD (arteriosclerotic heart disease)     CT chest 07/01/15 = mild coronary artery calcification  EST MIBI 08/20/15 = no ischemia, EF 74%     ??? Barrett's esophagus     Followed by Dr Delfino LovettVipul Shah     ??? Shingles   ??? Adrenal adenoma     MRI abdomen 06/10/15 = stable 2.4 cm right adrenal adenoma (compared to study 2016), fatty liver, several prominent gallstones within the gallbladder, stable pancreatic cystic lesions  Followed by Dr Lorna DibbleK Shreedhar     ??? Hypertension         Assessment/Plan:     1. Continue same meds, maintain low salt diet.  2. Patient to schedule repeat PET/CT in 6 months to re-assess RUL lung nodule.  3. Schedule echo and carotid US (re-assess AI/plaque).  4. Routine cardiac follow up 6 months.      Orders Placed This Encounter   ??? AMB POC EKG ROUTINE W/ 12 LEADS, INTER & REP       Follow-up Disposition:  Return in about 6 months (around 09/15/2016).    I discussed the patient's  BMI with her.  I have recommended the following interventions: dietary management education, guidance, and counseling .  The patient was counseled on the dangers of tobacco use.       I attest that current meds have been reviewed and are accurate    Tommie Sams, MD

## 2016-07-30 ENCOUNTER — Encounter: Payer: MEDICARE | Primary: Internal Medicine

## 2016-08-03 NOTE — Progress Notes (Signed)
Discussed results of echocardiogram showing mild AI/MR/TR, "new" PFO, normal LV function.  Continue present care.  Repeat study 2 years.

## 2016-08-05 ENCOUNTER — Encounter: Payer: MEDICARE | Primary: Internal Medicine

## 2016-09-07 MED ORDER — VALSARTAN 160 MG TAB
160 mg | ORAL_TABLET | Freq: Every day | ORAL | 3 refills | Status: DC
Start: 2016-09-07 — End: 2017-10-11

## 2016-09-30 ENCOUNTER — Ambulatory Visit
Admit: 2016-09-30 | Discharge: 2016-09-30 | Payer: MEDICARE | Attending: Cardiovascular Disease | Primary: Internal Medicine

## 2016-09-30 DIAGNOSIS — I10 Essential (primary) hypertension: Secondary | ICD-10-CM

## 2016-09-30 NOTE — Progress Notes (Signed)
Agree with the above plan of management

## 2016-09-30 NOTE — Progress Notes (Signed)
09/30/2016    CHIEF COMPLAINT: had concerns including Hypertension.   Subjective:   HISTORY OF PRESENT ILLNESS: Anita Robbins is a 71 y.o.female who presents for cardiology follow up.  Patient is active with Zumba class 3 days per week and denies chest pain, shortness breath, dizziness, diaphoresis, palpitations or fainting.  No recent fever, chills or cough.  No GI bleeding.  [facility] upper lobe pulmonary nodule which remained stable.  Patient gets repeat CT chest every 6 months.    Of note, systolic blood pressure at home 130s.    Allergies   Allergen Reactions   ??? Codeine Nausea Only     Current Outpatient Prescriptions   Medication Sig   ??? valsartan (DIOVAN) 160 mg tablet Take 1 Tab by mouth daily.   ??? aspirin delayed-release 81 mg tablet Take 81 mg by mouth daily.   ??? omeprazole (PRILOSEC) 20 mg capsule Take 20 mg by mouth daily.   ??? cholecalciferol, vitamin D3, (VITAMIN D3) 2,000 unit tab Take 2,000 Int'l Units by mouth daily.     No current facility-administered medications for this visit.      Past Medical History:   Diagnosis Date   ??? Adrenal adenoma    ??? Barrett's esophagus    ??? Hypertension    ??? Shingles      Past Surgical History:   Procedure Laterality Date   ??? HX BREAST LUMPECTOMY Left    ??? HX OVARIAN CYST REMOVAL Left      Social History     Social History   ??? Marital status: WIDOWED     Spouse name: N/A   ??? Number of children: N/A   ??? Years of education: N/A     Social History Main Topics   ??? Smoking status: Former Smoker     Packs/day: 1.00     Years: 50.00     Quit date: 05/04/2008   ??? Smokeless tobacco: Never Used   ??? Alcohol use None      Comment: social wine   ??? Drug use: None   ??? Sexual activity: Not Asked     Other Topics Concern   ??? None     Social History Narrative     Family History   Problem Relation Age of Onset   ??? Coronary Artery Disease Neg Hx    ??? Stroke Neg Hx        Objective:    PHYSICAL EXAM:  height is 5\' 5"  (1.651 m) and weight is 174 lb (78.9 kg). Her blood pressure is 150/90 and her pulse is 81. Her oxygen saturation is 97%.  Body mass index is 28.96 kg/(m^2).   HEENT:   no JVD or Bruits noted  HEART:   S1S2 with 2/6 SEM, no rubs/gallops  LUNGS:   CTA B/L with no rales/rhonchi/wheezing  ABD:        Soft NT/ND  EXT:        NO C/C/E    Labs 05/30/16 = electrolytes glucose LFT CBC:  WNL.    Patient Active Problem List    Diagnosis   ??? PFO (patent foramen ovale)   ??? Right thyroid nodule     PET/CT 10/07/15 = 9 mm right thyroid nodule showing hypermetabolism  Followed by Dr Kirtland BouchardK Shreedhar/[facility]     ??? Aortic insufficiency     Echo 07/30/16 = RVSP 33 mmHg, mild MR/TR/AI, new PFO, normal LV function   Echo 07/18/15 = RVSP 30 mmHg,  mild AI, trace MR, normal LV function     ??? Carotid artery plaque     Carotid US 08/05/16 = mild to moderate plaque both carotid bulbs, no stenosis (no change 07/2015)     ??? Gallstones     Seen on PET/CT 10/07/15, CT chest 07/01/15 and MRI abdomen 06/10/15     ??? Nodule of right lung     PET/CT 10/07/15 = 1.6 cm right upper lobe groundglass nodule which is not hypermetabolic, 3 mm nodule in the right upper lobe not seen on this study  CT chest 07/01/15 = 1.7 cm groundglass right upper lobe nodule, 3 mm right upper lobe subpleural nodule, mild paraseptal emphysematous change, gallstones in a contracted gallbladder  Followed by Dr Vilma Prader ([facility])     ??? COPD (chronic obstructive pulmonary disease) (HCC)     CT chest 07/01/15 = mild paraseptal emphysematous change  PFTs 06/27/15 = normal spirometry  Followed by Dr Bufford Buttner     ??? ASHD (arteriosclerotic heart disease)     CT chest 07/01/15 = mild coronary artery calcification  EST MIBI 08/20/15 = no ischemia, EF 74%     ??? Barrett's esophagus     Followed by Dr Delfino Lovett     ??? Shingles   ??? Adrenal adenoma     MRI abdomen 06/10/15 = stable 2.4 cm right adrenal adenoma (compared to study 2016), fatty liver, several prominent gallstones within the  gallbladder, stable pancreatic cystic lesions  Followed by Dr Lorna Dibble     ??? Hypertension         Assessment/Plan:     1. Blood pressure elevated.  Continue valsartan.  Strict low-sodium diet.  Patient to reevaluate with Dr. Louanna Raw over the next few weeks.  2. Continue same medications.  3. Routine blood work and follow-up with Dr. Louanna Raw.  4. Carotid ultrasound and follow-up our office 1 year.      Orders Placed This Encounter   ??? AMB POC EKG ROUTINE W/ 12 LEADS, INTER & REP       Follow-up Disposition:  Return in about 1 year (around 09/30/2017).    I discussed the patient's  BMI with her.  I have recommended the following interventions: dietary management education, guidance, and counseling .     The patient was counseled on the dangers of tobacco use.      I attest that current meds have been reviewed and are accurate    Viviann Spare, PA

## 2016-11-23 ENCOUNTER — Institutional Professional Consult (permissible substitution): Admit: 2016-11-23 | Discharge: 2016-11-23 | Payer: MEDICARE | Primary: Internal Medicine

## 2016-11-23 DIAGNOSIS — I10 Essential (primary) hypertension: Secondary | ICD-10-CM

## 2016-11-23 MED ORDER — LOSARTAN 50 MG TAB
50 mg | ORAL_TABLET | Freq: Every day | ORAL | 1 refills | Status: DC
Start: 2016-11-23 — End: 2017-10-11

## 2016-11-23 NOTE — Progress Notes (Signed)
Patient on Valsartan 160 mg daily from [place] supplier (banned by FDA). Will change to Losartan 50 mg daily.

## 2016-11-23 NOTE — Telephone Encounter (Signed)
Patient called pharmacy in regards to her Diovan (valsartan)  they informed her she needs to contact the doctor and prescribe a new medication.

## 2016-11-23 NOTE — Patient Instructions (Addendum)
Spoke with patient Agree with resuming Hctz; Diovan changed to Losartan due to FDA ban.

## 2017-10-06 ENCOUNTER — Encounter: Attending: Cardiovascular Disease | Primary: Internal Medicine

## 2017-10-06 ENCOUNTER — Encounter: Primary: Internal Medicine

## 2017-10-11 ENCOUNTER — Encounter: Payer: MEDICARE | Primary: Internal Medicine

## 2017-10-11 ENCOUNTER — Ambulatory Visit
Admit: 2017-10-11 | Discharge: 2017-10-11 | Payer: MEDICARE | Attending: Cardiovascular Disease | Primary: Internal Medicine

## 2017-10-11 DIAGNOSIS — I10 Essential (primary) hypertension: Secondary | ICD-10-CM

## 2017-10-11 NOTE — Progress Notes (Signed)
10/11/2017    CHIEF COMPLAINT: had concerns including Hypertension.   Subjective:   HISTORY OF PRESENT ILLNESS: Anita Robbins is a 72 y.o.female with a past medical history of hypertension, coronary atherosclerosis with negative nuclear stress test, PFO, Barrett's esophagus and thyroid nodules.  Patient presents for annual follow-up.  Patient states blood pressure somewhat erratic since changing ARB's secondary to recent recall.  Blood pressure now stable on telmisartan 40 mg daily. BP medications being managed by Dr Louanna Raw.  Patient otherwise doing Zumba class 3 days/week and denies chest pain, shortness of breath, dizziness, palpitations or fainting.      Allergies   Allergen Reactions   ??? Codeine Nausea Only     Current Outpatient Medications   Medication Sig   ??? telmisartan (MICARDIS) 40 mg tablet Take 40 mg by mouth daily.   ??? aspirin delayed-release 81 mg tablet Take 81 mg by mouth daily.   ??? omeprazole (PRILOSEC) 20 mg capsule Take 20 mg by mouth daily.   ??? cholecalciferol, vitamin D3, (VITAMIN D3) 2,000 unit tab Take 5,000 Int'l Units by mouth daily.     No current facility-administered medications for this visit.      Past Medical History:   Diagnosis Date   ??? Adrenal adenoma    ??? Barrett's esophagus    ??? Hypertension    ??? Shingles      Past Surgical History:   Procedure Laterality Date   ??? HX BREAST LUMPECTOMY Left    ??? HX OVARIAN CYST REMOVAL Left      Social History     Socioeconomic History   ??? Marital status: WIDOWED     Spouse name: Not on file   ??? Number of children: Not on file   ??? Years of education: Not on file   ??? Highest education level: Not on file   Tobacco Use   ??? Smoking status: Former Smoker     Packs/day: 1.00     Years: 50.00     Pack years: 50.00     Last attempt to quit: 05/04/2008     Years since quitting: 9.4   ??? Smokeless tobacco: Never Used     Family History   Problem Relation Age of Onset   ??? Coronary Artery Disease Neg Hx    ??? Stroke Neg Hx        Objective:   PHYSICAL EXAM:   weight is 175 lb (79.4 kg). Her blood pressure is 140/80 and her pulse is 80. Her oxygen saturation is 97%.  Body mass index is 29.12 kg/m??.   HEENT:   no JVD or Bruits noted  HEART:   S1S2 with no murmurs/rubs/gallops  LUNGS:   CTA B/L with no rales/rhonchi/wheezing  ABD:        Soft NT/ND  EXT:        NO C/C/E    EKG: Sinus Rhythm 77 bpm  Normal axis, early transition, no ST changes  (no change 09/2016)         Patient Active Problem List    Diagnosis   ??? PFO (patent foramen ovale)   ??? Right thyroid nodule     PET/CT 10/07/15 = 9 mm right thyroid nodule showing hypermetabolism  Followed by Dr Kirtland Bouchard Shreedhar/[facility]     ??? Aortic insufficiency     Echo 07/30/16 = RVSP 33 mmHg, mild MR/TR/AI, new PFO, normal LV function   Echo 07/18/15 = RVSP 30 mmHg, mild AI, trace MR, normal LV function     ???  Carotid artery plaque     Carotid US 10/11/17 = mild to moderate plaque both carotid bulbs, no stenosis (no change 07/2015)     ??? Gallstones     Seen on PET/CT 10/07/15, CT chest 07/01/15 and MRI abdomen 06/10/15     ??? Nodule of right lung     PET/CT 10/07/15 = 1.6 cm right upper lobe groundglass nodule which is not hypermetabolic, 3 mm nodule in the right upper lobe not seen on this study  CT chest 07/01/15 = 1.7 cm groundglass right upper lobe nodule, 3 mm right upper lobe subpleural nodule, mild paraseptal emphysematous change, gallstones in a contracted gallbladder  Followed by Dr Vilma PraderIsbell ([facility])     ??? COPD (chronic obstructive pulmonary disease) (HCC)     CT chest 07/01/15 = mild paraseptal emphysematous change  PFTs 06/27/15 = normal spirometry  Followed by Dr Bufford ButtnerMehta     ??? ASHD (arteriosclerotic heart disease)     CT chest 07/01/15 = mild coronary artery calcification  EST MIBI 08/20/15 = no ischemia, EF 74%     ??? Barrett's esophagus     Followed by Dr Delfino LovettVipul Shah     ??? Shingles   ??? Adrenal adenoma     MRI abdomen 06/10/15 = stable 2.4 cm right adrenal adenoma (compared to study 2016), fatty liver, several prominent gallstones within the gallbladder,  stable pancreatic cystic lesions  Followed by Dr Lorna DibbleK Shreedhar     ??? Hypertension         Assessment/Plan:     1. Anita Robbins is a 72 y.o.female with a past medical history of hypertension, coronary atherosclerosis with negative nuclear stress test, PFO, Barrett's esophagus and thyroid nodules.  2. Patient is doing well from a cardiac perspective at this time.  3. Continue same medications.  4. Check lipid panel for LDL goal<100 for carotid plaque.  5. Continue healthy diet and exercise.  6. Carotid study one year.  7. Echocardiogram and nuclear stress test 1 year.  8. Follow-up 1 year.      Orders Placed This Encounter   ??? LIPID PANEL   ??? AMB POC EKG ROUTINE W/ 12 LEADS, INTER & REP       Follow-up and Dispositions    ?? Return in about 1 year (around 10/12/2018).         I discussed the patient's  BMI with her.  I have recommended the following interventions: dietary management education, guidance, and counseling .     The patient was counseled on the dangers of tobacco use.      I attest that current meds have been reviewed and are accurate    Viviann SpareJames Jamariya Davidoff, PA

## 2017-10-11 NOTE — Progress Notes (Signed)
Agree with the above plan of management

## 2017-10-11 NOTE — Progress Notes (Signed)
10/11/2017    CHIEF COMPLAINT: had concerns including Hypertension.   Subjective:   HISTORY OF PRESENT ILLNESS: Anita Robbins is a 72 y.o.female with a past medical history of hypertension, coronary atherosclerosis with negative nuclear stress test, PFO, Barrett's esophagus and thyroid nodules.  Patient presents for annual follow-up.  Patient states blood pressure somewhat erratic since changing ARB's secondary to recent recall.  Blood pressure now stable on telmisartan 40 mg daily. BP medications being managed by Dr Louanna RawLaitman.  Patient otherwise doing Zumba class 3 days/week and denies chest pain, shortness of breath, dizziness, palpitations or fainting.      Allergies   Allergen Reactions   ??? Codeine Nausea Only     Current Outpatient Medications   Medication Sig   ??? telmisartan (MICARDIS) 40 mg tablet Take 40 mg by mouth daily.   ??? aspirin delayed-release 81 mg tablet Take 81 mg by mouth daily.   ??? omeprazole (PRILOSEC) 20 mg capsule Take 20 mg by mouth daily.   ??? cholecalciferol, vitamin D3, (VITAMIN D3) 2,000 unit tab Take 5,000 Int'l Units by mouth daily.     No current facility-administered medications for this visit.      Past Medical History:   Diagnosis Date   ??? Adrenal adenoma    ??? Barrett's esophagus    ??? Hypertension    ??? Shingles      Past Surgical History:   Procedure Laterality Date   ??? HX BREAST LUMPECTOMY Left    ??? HX OVARIAN CYST REMOVAL Left      Social History     Socioeconomic History   ??? Marital status: WIDOWED     Spouse name: Not on file   ??? Number of children: Not on file   ??? Years of education: Not on file   ??? Highest education level: Not on file   Tobacco Use   ??? Smoking status: Former Smoker     Packs/day: 1.00     Years: 50.00     Pack years: 50.00     Last attempt to quit: 05/04/2008     Years since quitting: 9.4   ??? Smokeless tobacco: Never Used     Family History   Problem Relation Age of Onset   ??? Coronary Artery Disease Neg Hx    ??? Stroke Neg Hx        Objective:    PHYSICAL EXAM:  weight is 175 lb (79.4 kg). Her blood pressure is 140/80 and her pulse is 80. Her oxygen saturation is 97%.  Body mass index is 29.12 kg/m??.   HEENT:   no JVD or Bruits noted  HEART:   S1S2 with no murmurs/rubs/gallops  LUNGS:   CTA B/L with no rales/rhonchi/wheezing  ABD:        Soft NT/ND  EXT:        NO C/C/E    EKG: Sinus Rhythm 77 bpm  Normal axis, early transition, no ST changes  (no change 09/2016)         Patient Active Problem List    Diagnosis   ??? PFO (patent foramen ovale)   ??? Right thyroid nodule     PET/CT 10/07/15 = 9 mm right thyroid nodule showing hypermetabolism  Followed by Dr Kirtland BouchardK Shreedhar/[facility]     ??? Aortic insufficiency     Echo 07/30/16 = RVSP 33 mmHg, mild MR/TR/AI, new PFO, normal LV function   Echo 07/18/15 = RVSP 30 mmHg, mild AI, trace MR, normal LV function     ???  Carotid artery plaque     Carotid US 10/11/17 = mild to moderate plaque both carotid bulbs, no stenosis (no change 07/2015)     ??? Gallstones     Seen on PET/CT 10/07/15, CT chest 07/01/15 and MRI abdomen 06/10/15     ??? Nodule of right lung     PET/CT 10/07/15 = 1.6 cm right upper lobe groundglass nodule which is not hypermetabolic, 3 mm nodule in the right upper lobe not seen on this study  CT chest 07/01/15 = 1.7 cm groundglass right upper lobe nodule, 3 mm right upper lobe subpleural nodule, mild paraseptal emphysematous change, gallstones in a contracted gallbladder  Followed by Dr Vilma Prader ([facility])     ??? COPD (chronic obstructive pulmonary disease) (HCC)     CT chest 07/01/15 = mild paraseptal emphysematous change  PFTs 06/27/15 = normal spirometry  Followed by Dr Bufford Buttner     ??? ASHD (arteriosclerotic heart disease)     CT chest 07/01/15 = mild coronary artery calcification  EST MIBI 08/20/15 = no ischemia, EF 74%     ??? Barrett's esophagus     Followed by Dr Delfino Lovett     ??? Shingles   ??? Adrenal adenoma     MRI abdomen 06/10/15 = stable 2.4 cm right adrenal adenoma (compared to study 2016), fatty liver, several prominent gallstones within the  gallbladder, stable pancreatic cystic lesions  Followed by Dr Lorna Dibble     ??? Hypertension         Assessment/Plan:     1. Anita Robbins is a 72 y.o.female with a past medical history of hypertension, coronary atherosclerosis with negative nuclear stress test, PFO, Barrett's esophagus and thyroid nodules.  2. Patient is doing well from a cardiac perspective at this time.  3. Continue same medications.  4. Check lipid panel for LDL goal<100 for carotid plaque.  5. Continue healthy diet and exercise.  6. Carotid study one year.  7. Echocardiogram and nuclear stress test 1 year.  8. Follow-up 1 year.      Orders Placed This Encounter   ??? LIPID PANEL   ??? AMB POC EKG ROUTINE W/ 12 LEADS, INTER & REP       Follow-up and Dispositions    ?? Return in about 1 year (around 10/12/2018).         I discussed the patient's  BMI with her.  I have recommended the following interventions: dietary management education, guidance, and counseling .     The patient was counseled on the dangers of tobacco use.      I attest that current meds have been reviewed and are accurate    Viviann Spare, PA

## 2017-10-19 LAB — LIPID PANEL
Chol/HDL Ratio: 3.8 calc (ref <5.0)
Cholesterol, Total: 184 mg/dL (ref <200)
Cholesterol, total: 184 mg/dL (ref <200)
Cholesterol/HDL ratio: 3.8 calc (ref <5.0)
HDL Cholesterol: 49 mg/dL — ABNORMAL LOW (ref >50)
HDL: 49 mg/dL — ABNORMAL LOW (ref >50)
LDL CHOL, CALCULATED: 103 mg/dL (calc) — ABNORMAL HIGH (ref <100)
LDL Calculated: 103 mg/dL (calc) — ABNORMAL HIGH (ref <100)
Non-HDL Cholesterol: 135 mg/dL (calc) — ABNORMAL HIGH (ref <130)
Non-HDL Cholesterol: 135 mg/dL (calc) — ABNORMAL HIGH (ref <130)
Triglyceride: 204 mg/dL — ABNORMAL HIGH (ref <150)
Triglycerides: 204 mg/dL — ABNORMAL HIGH (ref <150)

## 2017-10-19 MED ORDER — ROSUVASTATIN 5 MG TAB
5 mg | ORAL_TABLET | Freq: Every evening | ORAL | 6 refills | Status: DC
Start: 2017-10-19 — End: 2017-12-17

## 2017-10-19 NOTE — Progress Notes (Signed)
Discussed with patient results of blood work showing LDL 103.  Patient with coronary calcifications and carotid plaque.  Recommend Crestor 5 mg daily.  If patient tolerates will check labs in 6 weeks.

## 2017-10-19 NOTE — Progress Notes (Signed)
Discussed with patient results of blood work showing LDL 103.  Patient with coronary calcifications and carotid plaque.  Recommend Crestor 5 mg daily.  If patient tolerates will check labs in 6 weeks.

## 2017-11-29 ENCOUNTER — Encounter

## 2017-11-29 NOTE — Telephone Encounter (Signed)
Needs lab slip generic for the blood work you wanted please fax to [phone]

## 2017-12-20 MED ORDER — ROSUVASTATIN 5 MG TAB
5 mg | ORAL_TABLET | Freq: Every evening | ORAL | 3 refills | Status: AC
Start: 2017-12-20 — End: ?

## 2018-03-01 ENCOUNTER — Encounter

## 2018-10-19 ENCOUNTER — Encounter: Primary: Internal Medicine

## 2018-10-19 ENCOUNTER — Encounter: Attending: Cardiovascular Disease | Primary: Internal Medicine

## 2019-01-04 ENCOUNTER — Ambulatory Visit: Admit: 2019-01-04 | Discharge: 2019-01-04 | Payer: MEDICARE | Primary: Internal Medicine

## 2019-01-04 ENCOUNTER — Ambulatory Visit
Admit: 2019-01-04 | Discharge: 2019-01-04 | Payer: MEDICARE | Attending: Cardiovascular Disease | Primary: Internal Medicine

## 2019-01-04 ENCOUNTER — Ambulatory Visit: Attending: Cardiovascular Disease | Primary: Internal Medicine

## 2019-01-04 ENCOUNTER — Ambulatory Visit

## 2019-01-04 DIAGNOSIS — I2510 Atherosclerotic heart disease of native coronary artery without angina pectoris: Secondary | ICD-10-CM

## 2019-01-04 DIAGNOSIS — I6523 Occlusion and stenosis of bilateral carotid arteries: Secondary | ICD-10-CM

## 2019-01-04 NOTE — Progress Notes (Signed)
Agree with the above plan of management

## 2019-01-04 NOTE — Progress Notes (Signed)
01/04/2019    CHIEF COMPLAINT: had concerns including Follow-up; Hypertension; and Cholesterol Problem.   Subjective:   HISTORY OF PRESENT ILLNESS: Anita Robbins is a 73 y.o.female with hypertension, hyperlipidemia, coronary calcification, Barrett's esophagus, adrenal adenoma, right lung nodule and COPD.    Patient presents for routine follow-up.  She remains fairly active and asymptomatic denying chest pain, shortness of breath, dizziness, diaphoresis, palpitations or fainting.  No recent fever, chills or cough.  No GI bleeding.    Allergies   Allergen Reactions   ??? Codeine Nausea Only     Current Outpatient Medications   Medication Sig   ??? amLODIPine (NORVASC) 5 mg tablet TAKE ONE TABLET BY MOUTH EVERY DAY   ??? irbesartan-hydroCHLOROthiazide (AVALIDE) 150-12.5 mg per tablet TAKE ONE TABLET BY MOUTH EVERY DAY   ??? rosuvastatin (CRESTOR) 5 mg tablet Take 1 Tab by mouth nightly.   ??? aspirin delayed-release 81 mg tablet Take 81 mg by mouth daily.   ??? omeprazole (PRILOSEC) 20 mg capsule Take 20 mg by mouth daily.   ??? cholecalciferol, vitamin D3, (VITAMIN D3) 2,000 unit tab Take 5,000 Int'l Units by mouth daily.     No current facility-administered medications for this visit.      Past Medical History:   Diagnosis Date   ??? Adrenal adenoma    ??? Barrett's esophagus    ??? Hypertension    ??? Shingles      Past Surgical History:   Procedure Laterality Date   ??? HX BREAST LUMPECTOMY Left    ??? HX OVARIAN CYST REMOVAL Left      Social History     Socioeconomic History   ??? Marital status: WIDOWED     Spouse name: Not on file   ??? Number of children: Not on file   ??? Years of education: Not on file   ??? Highest education level: Not on file   Tobacco Use   ??? Smoking status: Former Smoker     Packs/day: 1.00     Years: 50.00     Pack years: 50.00     Last attempt to quit: 05/04/2008     Years since quitting: 10.6   ??? Smokeless tobacco: Never Used     Family History   Problem Relation Age of Onset   ??? Coronary Artery Disease Neg Hx    ???  Stroke Neg Hx          Objective:   PHYSICAL EXAM:  height is 5\' 5"  (1.651 m) and weight is 176 lb (79.8 kg). Her blood pressure is 120/70 and her pulse is 83. Her oxygen saturation is 96%.  Body mass index is 29.29 kg/m??.   HEENT:   no JVD or Bruits noted  HEART:   S1S2 with no murmurs/rubs/gallops  LUNGS:   CTA B/L with no rales/rhonchi/wheezing  ABD:        Soft NT/ND  EXT:        NO C/C/E    Labs 12/22/2018 = total cholesterol 127 HDL 49 LDL 57 triglycerides 129.  TFT glucose electrolytes LFT: WNL.    Patient Active Problem List    Diagnosis   ??? Hypercholesterolemia   ??? PFO (patent foramen ovale)   ??? Right thyroid nodule     PET/CT 10/07/15 = 9 mm right thyroid nodule showing hypermetabolism  Followed by Dr Raliegh Ip Shreedhar/[facility]     ??? Aortic insufficiency     Echo 07/30/16 = RVSP 33 mmHg, mild MR/TR/AI, new PFO, normal LV function  Echo 07/18/15 = RVSP 30 mmHg, mild AI, trace MR, normal LV function     ??? Carotid artery plaque     Carotid US 10/11/17 = mild to moderate plaque both carotid bulbs, no stenosis (no change 07/2015)     ??? Gallstones     Seen on PET/CT 10/07/15, CT chest 07/01/15 and MRI abdomen 06/10/15     ??? Nodule of right lung     PET/CT 10/07/15 = 1.6 cm right upper lobe groundglass nodule which is not hypermetabolic, 3 mm nodule in the right upper lobe not seen on this study  CT chest 07/01/15 = 1.7 cm groundglass right upper lobe nodule, 3 mm right upper lobe subpleural nodule, mild paraseptal emphysematous change, gallstones in a contracted gallbladder  Followed by Dr Vilma PraderIsbell ([facility])     ??? COPD (chronic obstructive pulmonary disease) (HCC)     CT chest 07/01/15 = mild paraseptal emphysematous change  PFTs 06/27/15 = normal spirometry  Followed by Dr Bufford ButtnerMehta     ??? ASHD (arteriosclerotic heart disease)     CT chest 07/01/15 = mild coronary artery calcification  EST MIBI 08/20/15 = no ischemia, EF 74%     ??? Barrett's esophagus     Followed by Dr Delfino LovettVipul Shah     ??? Shingles   ??? Adrenal adenoma     MRI abdomen 06/10/15 = stable 2.4  cm right adrenal adenoma (compared to study 2016), fatty liver, several prominent gallstones within the gallbladder, stable pancreatic cystic lesions  Followed by Dr Lorna DibbleK Shreedhar     ??? Hypertension         Assessment/Plan:     Anita PortKathleen Robbins is a 73 y.o.female with hypertension, hyperlipidemia, coronary calcification, Barrett's esophagus, adrenal adenoma, right lung nodule and COPD.    1. Patient is doing well from a cardiac perspective at this time.  2. Continue same medications.  3. Echocardiogram for history of mild AI/MR/TR.  4. EST for history of coronary calcifications.        No orders of the defined types were placed in this encounter.      Follow-up and Dispositions    ?? Return in about 6 weeks (around 02/15/2019).         I discussed the patient's  BMI with her.  I have recommended the following interventions: dietary management education, guidance, and counseling .     The patient was counseled on the dangers of tobacco use.      I attest that current meds have been reviewed and are accurate    Viviann SpareJames Kathline Banbury, PA

## 2019-01-04 NOTE — Progress Notes (Signed)
01/04/2019    CHIEF COMPLAINT: had concerns including Follow-up; Hypertension; and Cholesterol Problem.   Subjective:   HISTORY OF PRESENT ILLNESS: Anita Robbins is a 73 y.o.female with hypertension, hyperlipidemia, coronary calcification, Barrett's esophagus, adrenal adenoma, right lung nodule and COPD.    Patient presents for routine follow-up.  She remains fairly active and asymptomatic denying chest pain, shortness of breath, dizziness, diaphoresis, palpitations or fainting.  No recent fever, chills or cough.  No GI bleeding.    Allergies   Allergen Reactions   ??? Codeine Nausea Only     Current Outpatient Medications   Medication Sig   ??? amLODIPine (NORVASC) 5 mg tablet TAKE ONE TABLET BY MOUTH EVERY DAY   ??? irbesartan-hydroCHLOROthiazide (AVALIDE) 150-12.5 mg per tablet TAKE ONE TABLET BY MOUTH EVERY DAY   ??? rosuvastatin (CRESTOR) 5 mg tablet Take 1 Tab by mouth nightly.   ??? aspirin delayed-release 81 mg tablet Take 81 mg by mouth daily.   ??? omeprazole (PRILOSEC) 20 mg capsule Take 20 mg by mouth daily.   ??? cholecalciferol, vitamin D3, (VITAMIN D3) 2,000 unit tab Take 5,000 Int'l Units by mouth daily.     No current facility-administered medications for this visit.      Past Medical History:   Diagnosis Date   ??? Adrenal adenoma    ??? Barrett's esophagus    ??? Hypertension    ??? Shingles      Past Surgical History:   Procedure Laterality Date   ??? HX BREAST LUMPECTOMY Left    ??? HX OVARIAN CYST REMOVAL Left      Social History     Socioeconomic History   ??? Marital status: WIDOWED     Spouse name: Not on file   ??? Number of children: Not on file   ??? Years of education: Not on file   ??? Highest education level: Not on file   Tobacco Use   ??? Smoking status: Former Smoker     Packs/day: 1.00     Years: 50.00     Pack years: 50.00     Last attempt to quit: 05/04/2008     Years since quitting: 10.6   ??? Smokeless tobacco: Never Used     Family History   Problem Relation Age of Onset   ??? Coronary Artery Disease Neg Hx     ??? Stroke Neg Hx          Objective:   PHYSICAL EXAM:  height is 5\' 5"  (1.651 m) and weight is 176 lb (79.8 kg). Her blood pressure is 120/70 and her pulse is 83. Her oxygen saturation is 96%.  Body mass index is 29.29 kg/m??.   HEENT:   no JVD or Bruits noted  HEART:   S1S2 with no murmurs/rubs/gallops  LUNGS:   CTA B/L with no rales/rhonchi/wheezing  ABD:        Soft NT/ND  EXT:        NO C/C/E    Labs 12/22/2018 = total cholesterol 127 HDL 49 LDL 57 triglycerides 129.  TFT glucose electrolytes LFT: WNL.    Patient Active Problem List    Diagnosis   ??? Hypercholesterolemia   ??? PFO (patent foramen ovale)   ??? Right thyroid nodule     PET/CT 10/07/15 = 9 mm right thyroid nodule showing hypermetabolism  Followed by Dr Kirtland BouchardK Shreedhar/[facility]     ??? Aortic insufficiency     Echo 07/30/16 = RVSP 33 mmHg, mild MR/TR/AI, new PFO, normal LV function  Echo 07/18/15 = RVSP 30 mmHg, mild AI, trace MR, normal LV function     ??? Carotid artery plaque     Carotid US 10/11/17 = mild to moderate plaque both carotid bulbs, no stenosis (no change 07/2015)     ??? Gallstones     Seen on PET/CT 10/07/15, CT chest 07/01/15 and MRI abdomen 06/10/15     ??? Nodule of right lung     PET/CT 10/07/15 = 1.6 cm right upper lobe groundglass nodule which is not hypermetabolic, 3 mm nodule in the right upper lobe not seen on this study  CT chest 07/01/15 = 1.7 cm groundglass right upper lobe nodule, 3 mm right upper lobe subpleural nodule, mild paraseptal emphysematous change, gallstones in a contracted gallbladder  Followed by Dr Vito Berger ([facility])     ??? COPD (chronic obstructive pulmonary disease) (HCC)     CT chest 07/01/15 = mild paraseptal emphysematous change  PFTs 06/27/15 = normal spirometry  Followed by Dr Murvin Natal     ??? ASHD (arteriosclerotic heart disease)     CT chest 07/01/15 = mild coronary artery calcification  EST MIBI 08/20/15 = no ischemia, EF 74%     ??? Barrett's esophagus     Followed by Dr Max Sane     ??? Shingles   ??? Adrenal adenoma      MRI abdomen 06/10/15 = stable 2.4 cm right adrenal adenoma (compared to study 2016), fatty liver, several prominent gallstones within the gallbladder, stable pancreatic cystic lesions  Followed by Dr Vincenza Hews     ??? Hypertension         Assessment/Plan:     Anita Robbins is a 73 y.o.female with hypertension, hyperlipidemia, coronary calcification, Barrett's esophagus, adrenal adenoma, right lung nodule and COPD.    1. Patient is doing well from a cardiac perspective at this time.  2. Continue same medications.  3. Echocardiogram for history of mild AI/MR/TR.  4. EST for history of coronary calcifications.        No orders of the defined types were placed in this encounter.      Follow-up and Dispositions    ?? Return in about 6 weeks (around 02/15/2019).         I discussed the patient's  BMI with her.  I have recommended the following interventions: dietary management education, guidance, and counseling .     The patient was counseled on the dangers of tobacco use.      I attest that current meds have been reviewed and are accurate    Dorothe Pea, PA

## 2019-01-05 LAB — DUPLEX CAROTID BILATERAL
Left CCA dist dias: 31.7 cm/s
Left CCA dist sys: 107 cm/s
Left ICA prox dias: 28.8 cm/s
Left ICA prox sys: 86.5 cm/s
Left ICA/CCA sys: 0.81
Right CCA dist dias: 33.1 cm/s
Right ICA prox dias: 27.4 cm/s
Right ICA prox sys: 79.3 cm/s
Right ICA/CCA sys: 0.73
Right cca dist sys: 108 cm/s

## 2019-01-05 LAB — VAS DUP CAROTID BILATERAL
Left CCA dist EDV: 31.7 cm/s
Left CCA dist PSV: 107 cm/s
Left ICA prox EDV: 28.8 cm/s
Left ICA prox PSV: 86.5 cm/s
Left ICA/CCA PSV: 0.81
Right CCA dist EDV: 33.1 cm/s
Right ICA prox EDV: 27.4 cm/s
Right ICA prox PSV: 79.3 cm/s
Right ICA/CCA PSV: 0.73
Right cca dist PSV: 108 cm/s

## 2019-02-27 ENCOUNTER — Ambulatory Visit: Admit: 2019-02-27 | Discharge: 2019-02-27 | Payer: MEDICARE | Primary: Internal Medicine

## 2019-02-27 ENCOUNTER — Ambulatory Visit

## 2019-02-27 DIAGNOSIS — I2510 Atherosclerotic heart disease of native coronary artery without angina pectoris: Secondary | ICD-10-CM

## 2019-03-01 ENCOUNTER — Institutional Professional Consult (permissible substitution)
Admit: 2019-03-01 | Discharge: 2019-03-01 | Payer: MEDICARE | Attending: Cardiovascular Disease | Primary: Internal Medicine

## 2019-03-01 ENCOUNTER — Ambulatory Visit: Admit: 2019-03-01 | Discharge: 2019-03-01 | Payer: MEDICARE | Primary: Internal Medicine

## 2019-03-01 ENCOUNTER — Ambulatory Visit

## 2019-03-01 DIAGNOSIS — I2510 Atherosclerotic heart disease of native coronary artery without angina pectoris: Secondary | ICD-10-CM

## 2019-03-01 LAB — EXERCISE CARDIAC STRESS TEST: Stress Target HR: 145 {beats}/min

## 2019-03-01 LAB — STRESS TEST ONLY EXERCISE: Stress Target HR: 145 {beats}/min

## 2019-03-01 NOTE — Progress Notes (Signed)
Agree with the above plan of management

## 2019-03-01 NOTE — Progress Notes (Signed)
03/01/2019    CHIEF COMPLAINT: had concerns including Cardiac Testing and Hypertension.   Subjective:   HISTORY OF PRESENT ILLNESS: Anita Robbins is a 73 y.o.female with hypertension, hyperlipidemia, coronary calcification, Barrett's esophagus, adrenal adenoma, right lung nodule and COPD.    Patient presents today for routine exercise stress testing.  She is fairly active with exercise at least 2 days/week and denies chest pain, shortness of breath, dizziness, diaphoresis, palpitations or fainting.  No recent fever, chills or cough.  No GI bleeding.  No leg swelling.  Weight is stable.      Allergies   Allergen Reactions   ??? Codeine Nausea Only     Current Outpatient Medications   Medication Sig   ??? amLODIPine (NORVASC) 5 mg tablet TAKE ONE TABLET BY MOUTH EVERY DAY   ??? irbesartan-hydroCHLOROthiazide (AVALIDE) 150-12.5 mg per tablet TAKE ONE TABLET BY MOUTH EVERY DAY   ??? rosuvastatin (CRESTOR) 5 mg tablet Take 1 Tab by mouth nightly.   ??? aspirin delayed-release 81 mg tablet Take 81 mg by mouth daily.   ??? omeprazole (PRILOSEC) 20 mg capsule Take 20 mg by mouth daily.   ??? cholecalciferol, vitamin D3, (VITAMIN D3) 2,000 unit tab Take 5,000 Int'l Units by mouth daily.     No current facility-administered medications for this visit.      Past Medical History:   Diagnosis Date   ??? Adrenal adenoma    ??? Barrett's esophagus    ??? Hypertension    ??? Shingles      Past Surgical History:   Procedure Laterality Date   ??? HX BREAST LUMPECTOMY Left    ??? HX OVARIAN CYST REMOVAL Left      Social History     Socioeconomic History   ??? Marital status: WIDOWED     Spouse name: Not on file   ??? Number of children: Not on file   ??? Years of education: Not on file   ??? Highest education level: Not on file   Tobacco Use   ??? Smoking status: Former Smoker     Packs/day: 1.00     Years: 50.00     Pack years: 50.00     Last attempt to quit: 05/04/2008     Years since quitting: 10.8   ??? Smokeless tobacco: Never Used     Family History   Problem  Relation Age of Onset   ??? Coronary Artery Disease Neg Hx    ??? Stroke Neg Hx        Objective:   PHYSICAL EXAM:  weight is 176 lb (79.8 kg). Her temperature is 98.2 ??F (36.8 ??C). Her blood pressure is 130/72. Her respiration is 14.  Body mass index is 29.29 kg/m??.   HEENT:   no JVD or Bruits noted  HEART:   S1S2 with 1/6 HSM LSB, no rubs/gallops  LUNGS:   CTA B/L with no rales/rhonchi/wheezing  ABD:        Soft NT/ND  EXT:        NO C/C/E    EKG: Sinus Rhythm 86 bpm, normal axis, no ST abnormality.    Bruce protocol EST: Stress testing today was negative for ischemic EKG changes as well as chest pain at a maximal heart rate of 139 bpm which is 95% of her predicted maximal heart rate.  Patient walked for 6 minutes.  METS achieved 7.2.  Patient had a maximal blood pressure of 200/82 mmHg.      Patient Active Problem List  Diagnosis   ??? Hyperlipidemia   ??? PFO (patent foramen ovale)   ??? Right thyroid nodule     PET/CT 10/07/15 = 9 mm right thyroid nodule showing hypermetabolism  Followed by Dr Kirtland Bouchard Shreedhar/[facility]     ??? Aortic insufficiency     Echo 07/30/16 = RVSP 33 mmHg, mild MR/TR/AI, new PFO, normal LV function   Echo 07/18/15 = RVSP 30 mmHg, mild AI, trace MR, normal LV function     ??? Carotid artery plaque     Carotid US 01/04/2019 = mild to moderate plaque both carotid bulbs, no stenosis (no change 07/2015)     ??? Gallstones     Seen on PET/CT 10/07/15, CT chest 07/01/15 and MRI abdomen 06/10/15     ??? Nodule of right lung     PET/CT 10/07/15 = 1.6 cm right upper lobe groundglass nodule which is not hypermetabolic, 3 mm nodule in the right upper lobe not seen on this study  CT chest 07/01/15 = 1.7 cm groundglass right upper lobe nodule, 3 mm right upper lobe subpleural nodule, mild paraseptal emphysematous change, gallstones in a contracted gallbladder  Followed by Dr Vilma Prader ([facility])     ??? COPD (chronic obstructive pulmonary disease) (HCC)     CT chest 07/01/15 = mild paraseptal emphysematous change  PFTs 06/27/15 = normal  spirometry  Followed by Dr Bufford Buttner     ??? ASHD (arteriosclerotic heart disease)     CT chest 07/01/15 = mild coronary artery calcification    EST 03/01/2019 = no evidence of ischemia  EST MIBI 08/20/15 = no ischemia, EF 74%     ??? Barrett's esophagus     Followed by Dr Delfino Lovett     ??? Shingles   ??? Adrenal adenoma     MRI abdomen 06/10/15 = stable 2.4 cm right adrenal adenoma (compared to study 2016), fatty liver, several prominent gallstones within the gallbladder, stable pancreatic cystic lesions  Followed by Dr Lorna Dibble     ??? Hypertension         Assessment/Plan:   Anita Robbins is a 73 y.o.female with hypertension, hyperlipidemia, coronary calcification, Barrett's esophagus, adrenal adenoma, right lung nodule and COPD.    1. Patient with history of coronary calcification with exercise stress test today showing no ischemic EKG changes or ischemic symptoms of chest pain.  2. Elevated BP response with exercise but patient did not take BP medications today.  3. Patient's overall cardiac status stable at this time.  4. Continue same medications.  5. Improve diet.  Continue exercise.  6. Routine blood work through PMD.  7. Carotid 1 year (plaque). Will review echo done earlier this week.  8. Follow-up 1 year.      No orders of the defined types were placed in this encounter.      Follow-up and Dispositions    ?? Return in about 1 year (around 02/29/2020).         I discussed the patient's  BMI with her.  I have recommended the following interventions: dietary management education, guidance, and counseling .     The patient was counseled on the dangers of tobacco use.      I attest that current meds have been reviewed and are accurate    Alessandra Bevels, MD

## 2019-03-01 NOTE — Progress Notes (Signed)
03/01/2019    CHIEF COMPLAINT: had concerns including Cardiac Testing and Hypertension.   Subjective:   HISTORY OF PRESENT ILLNESS: Anita Robbins is a 73 y.o.female with hypertension, hyperlipidemia, coronary calcification, Barrett's esophagus, adrenal adenoma, right lung nodule and COPD.    Patient presents today for routine exercise stress testing.  She is fairly active with exercise at least 2 days/week and denies chest pain, shortness of breath, dizziness, diaphoresis, palpitations or fainting.  No recent fever, chills or cough.  No GI bleeding.  No leg swelling.  Weight is stable.      Allergies   Allergen Reactions   ??? Codeine Nausea Only     Current Outpatient Medications   Medication Sig   ??? amLODIPine (NORVASC) 5 mg tablet TAKE ONE TABLET BY MOUTH EVERY DAY   ??? irbesartan-hydroCHLOROthiazide (AVALIDE) 150-12.5 mg per tablet TAKE ONE TABLET BY MOUTH EVERY DAY   ??? rosuvastatin (CRESTOR) 5 mg tablet Take 1 Tab by mouth nightly.   ??? aspirin delayed-release 81 mg tablet Take 81 mg by mouth daily.   ??? omeprazole (PRILOSEC) 20 mg capsule Take 20 mg by mouth daily.   ??? cholecalciferol, vitamin D3, (VITAMIN D3) 2,000 unit tab Take 5,000 Int'l Units by mouth daily.     No current facility-administered medications for this visit.      Past Medical History:   Diagnosis Date   ??? Adrenal adenoma    ??? Barrett's esophagus    ??? Hypertension    ??? Shingles      Past Surgical History:   Procedure Laterality Date   ??? HX BREAST LUMPECTOMY Left    ??? HX OVARIAN CYST REMOVAL Left      Social History     Socioeconomic History   ??? Marital status: WIDOWED     Spouse name: Not on file   ??? Number of children: Not on file   ??? Years of education: Not on file   ??? Highest education level: Not on file   Tobacco Use   ??? Smoking status: Former Smoker     Packs/day: 1.00     Years: 50.00     Pack years: 50.00     Last attempt to quit: 05/04/2008     Years since quitting: 10.8   ??? Smokeless tobacco: Never Used     Family History    Problem Relation Age of Onset   ??? Coronary Artery Disease Neg Hx    ??? Stroke Neg Hx        Objective:   PHYSICAL EXAM:  weight is 176 lb (79.8 kg). Her temperature is 98.2 ??F (36.8 ??C). Her blood pressure is 130/72. Her respiration is 14.  Body mass index is 29.29 kg/m??.   HEENT:   no JVD or Bruits noted  HEART:   S1S2 with 1/6 HSM LSB, no rubs/gallops  LUNGS:   CTA B/L with no rales/rhonchi/wheezing  ABD:        Soft NT/ND  EXT:        NO C/C/E    EKG: Sinus Rhythm 86 bpm, normal axis, no ST abnormality.    Bruce protocol EST: Stress testing today was negative for ischemic EKG changes as well as chest pain at a maximal heart rate of 139 bpm which is 95% of her predicted maximal heart rate.  Patient walked for 6 minutes.  METS achieved 7.2.  Patient had a maximal blood pressure of 200/82 mmHg.      Patient Active Problem List  Diagnosis   ??? Hyperlipidemia   ??? PFO (patent foramen ovale)   ??? Right thyroid nodule     PET/CT 10/07/15 = 9 mm right thyroid nodule showing hypermetabolism  Followed by Dr Raliegh Ip [facility]reed[facility]     ??? Aortic insufficiency     Echo 07/30/16 = RVSP 33 mmHg, mild MR/TR/AI, new PFO, normal LV function   Echo 07/18/15 = RVSP 30 mmHg, mild AI, trace MR, normal LV function     ??? Carotid artery plaque     Carotid US 01/04/2019 = mild to moderate plaque both carotid bulbs, no stenosis (no change 07/2015)     ??? Gallstones     Seen on PET/CT 10/07/15, CT chest 07/01/15 and MRI abdomen 06/10/15     ??? Nodule of right lung     PET/CT 10/07/15 = 1.6 cm right upper lobe groundglass nodule which is not hypermetabolic, 3 mm nodule in the right upper lobe not seen on this study  CT chest 07/01/15 = 1.7 cm groundglass right upper lobe nodule, 3 mm right upper lobe subpleural nodule, mild paraseptal emphysematous change, gallstones in a contracted gallbladder  Followed by Dr Vito Berger ([facility])     ??? COPD (chronic obstructive pulmonary disease) (HCC)     CT chest 07/01/15 = mild paraseptal emphysematous change   PFTs 06/27/15 = normal spirometry  Followed by Dr Murvin Natal     ??? ASHD (arteriosclerotic heart disease)     CT chest 07/01/15 = mild coronary artery calcification    EST 03/01/2019 = no evidence of ischemia  EST MIBI 08/20/15 = no ischemia, EF 74%     ??? Barrett's esophagus     Followed by Dr Max Sane     ??? Shingles   ??? Adrenal adenoma     MRI abdomen 06/10/15 = stable 2.4 cm right adrenal adenoma (compared to study 2016), fatty liver, several prominent gallstones within the gallbladder, stable pancreatic cystic lesions  Followed by Dr Vincenza Hews     ??? Hypertension         Assessment/Plan:   Anita Robbins is a 73 y.o.female with hypertension, hyperlipidemia, coronary calcification, Barrett's esophagus, adrenal adenoma, right lung nodule and COPD.    1. Patient with history of coronary calcification with exercise stress test today showing no ischemic EKG changes or ischemic symptoms of chest pain.  2. Elevated BP response with exercise but patient did not take BP medications today.  3. Patient's overall cardiac status stable at this time.  4. Continue same medications.  5. Improve diet.  Continue exercise.  6. Routine blood work through PMD.  7. Carotid 1 year (plaque). Will review echo done earlier this week.  8. Follow-up 1 year.      No orders of the defined types were placed in this encounter.      Follow-up and Dispositions    ?? Return in about 1 year (around 02/29/2020).         I discussed the patient's  BMI with her.  I have recommended the following interventions: dietary management education, guidance, and counseling .     The patient was counseled on the dangers of tobacco use.      I attest that current meds have been reviewed and are accurate    Tommie Sams, MD

## 2019-03-02 ENCOUNTER — Encounter

## 2019-03-02 LAB — ECHO ADULT COMPLETE
AV Area by Peak Velocity: 2.7 cm2
AV Area by VTI: 2.3 cm2
AV Mean Gradient: 3.6 mm[Hg]
AV Mean Velocity: 0.8829 m/s
AV Peak Gradient: 6.4 mm[Hg]
AV Peak Velocity: 126.73 cm/s
AV VTI Ratio: 0.7
AV VTI: 24.48 cm
AV Velocity Ratio: 0.82
AVA/BSA Peak Velocity: 1.4 cm2/m2
AVA/BSA VTI: 1.2 cm2/m2
Aortic Arch: 2.6 cm
Aortic Root: 2.76 cm
Ascending Aorta: 2.6 cm
Descending Aorta: 2.6 cm
Est. RA Pressure: 3 mm[Hg]
IVC Sniffing: 1.64 cm
IVSd M-mode: 0.93 cm — AB
IVSs M-mode: 1.05 cm
LA Major Axis: 2.69 cm
LA Minor Axis: 1.44 cm
LV EDV A4C: 147.8 mL
LV EDV Index A4C: 78.9 mL/m2
LV ESV A4C: 75.5 mL
LV ESV Index A4C: 40.3 mL/m2
LV Ejection Fraction A4C: 49 %
LVIDd M-mode: 4.53 cm
LVIDs M-mode: 2.85 cm
LVOT Diameter: 2.05 cm
LVOT Peak Gradient: 4.3 mm[Hg]
LVOT Peak Velocity: 104.17 cm/s
LVOT VTI: 17.43 cm
LVPWd M-mode: 0.93 cm — AB
LVPWs M-mode: 0.93 cm
Left Ventricular Fractional Shortening: 36.9863 %
Left Ventricular Outflow Tract Mean Gradient: 1.9193 mm[Hg]
Left Ventricular Outflow Tract Mean Velocity: 0.6287 cm/s
Left Ventricular Stroke Volume by 2-D Single Plane- MOD: 37.7794 mL
MV A Velocity: 94.93 cm/s
MV Area by PHT: 2.5 cm2
MV E Velocity: 65.92 cm/s
MV E Wave Deceleration Time: 299.7 ms
MV E/A: 0.69
MV PHT: 86.9 ms
Mitral Valve Deceleration Slope: 2.1997
PASP: 6.9 mm[Hg]
PV Max Velocity: 94.93 cm/s
PV Peak Gradient: 3.6 mm[Hg]
RVSP: 6.9 mm[Hg]
TR Max Velocity: 98.17 cm/s
TR Peak Gradient: 3.9 mm[Hg]

## 2019-03-02 LAB — TRANSTHORACIC ECHOCARDIOGRAM (TTE) COMPLETE (CONTRAST/BUBBLE/3D PRN)
AV Area by Peak Velocity: 2.7 cm2
AV Area by VTI: 2.3 cm2
AV Mean Gradient: 3.6 mmHg
AV Mean Velocity: 0.8829 m/s
AV Peak Gradient: 6.4 mmHg
AV Peak Velocity: 126.73 cm/s
AV VTI: 24.48 cm
AV Velocity Ratio: 0.82
AVA/BSA Peak Velocity: 1.4 cm2/m2
AVA/BSA VTI: 1.2 cm2/m2
Aortic Arch: 2.6 cm
Aortic Root: 2.76 cm
Ascending Aorta: 2.6 cm
Descending Aorta: 2.6 cm
Est. RA Pressure: 3 mmHg
IVC Sniffing: 1.64 cm
IVSd M-mode: 0.93 cm — AB
IVSs M-mode: 1.05 cm
LA Major Axis: 2.69 cm
LA Minor Axis: 1.44 cm
LV EDV A4C: 147.8 mL
LV EDV Index A4C: 78.9 mL/m2
LV ESV A4C: 37.7794 mL
LV ESV A4C: 75.5 mL
LV ESV Index A4C: 40.3 mL/m2
LV Ejection Fraction A4C: 49 %
LVIDd M-mode: 4.53 cm
LVIDs M-mode: 2.85 cm
LVOT Diameter: 2.05 cm
LVOT Mean Gradient: 1.9193 mmHg
LVOT Peak Gradient: 4.3 mmHg
LVOT Peak Velocity: 104.17 cm/s
LVOT VTI: 17.43 cm
LVOT:AV VTI Index: 0.7
LVPWd M-mode: 0.93 cm — AB
LVPWs M-mode: 0.93 cm
Left Ventricular Ejection Fraction: 63
Left Ventricular Fractional Shortening: 36.9863 %
Left Ventricular Outflow Tract Mean Velocity: 0.6287 cm/s
MV A Velocity: 94.93 cm/s
MV Area by PHT: 2.5 cm2
MV E Velocity: 65.92 cm/s
MV E Wave Deceleration Time: 299.7 ms
MV E/A: 0.69
MV PHT: 86.9 ms
Mitral Valve E-F Slope by M-mode: 2.1997
PASP: 6.9 mmHg
PV Max Velocity: 94.93 cm/s
PV Peak Gradient: 3.6 mmHg
RVSP: 6.9 mmHg
TR Max Velocity: 98.17 cm/s
TR Peak Gradient: 3.9 mmHg

## 2019-03-07 NOTE — Progress Notes (Signed)
Discussed with patient results of echo showing mild valve disease with normal LV function.  No PFO seen in this study.  Continue present care.

## 2019-03-07 NOTE — Progress Notes (Signed)
Discussed with patient results of echo showing mild valve disease with normal LV function.  No PFO seen in this study.  Continue present care.

## 2020-03-06 ENCOUNTER — Encounter: Payer: MEDICARE | Primary: Internal Medicine

## 2020-03-13 ENCOUNTER — Encounter: Attending: Cardiovascular Disease | Primary: Internal Medicine

## 2020-10-01 IMAGING — CT CT CHEST WITHOUT CONTRAST
2 of 3 series · 15 of 36 positions shown, 18 images · non-contrast
Comparison: None

ADDENDED Diagnostic Imaging Report 
________________________________________________________________________________________________ 
******** ADDENDUM #1 ********/n 
Comparison exam from 10/12/2019 and April 2019 is now available for direct 
comparison. 
The lesion has shown very minimal change since April 2019. The groundglass 
component previously 21 x 20 mm at that time currently 23 x 21 mm. Solid 
component 10 mm at this time previously back in April 2019 10 mm. 
Given the stability since at least April 2019 consider follow-up examination 
for continued surveillance in 12 months. 
CT CHEST WITHOUT CONTRAST, 10/01/2020 [DATE]: 
CLINICAL INDICATION: Pulmonary nodule. 
A search for DICOM formatted images was conducted for prior CT imaging studies 
completed at a non-affiliated media free facility.
TECHNIQUE: The chest was scanned from base of neck through the lung bases 
without contrast on a high resolution low dose CT scanner. Routine MPR and MIP 
3D renderings were reconstructed on an independent workstation with concurrent 
physician supervision.

[Series 2: chest 2.0 i31s 3 · axial · 0.73mm/px · z∈[-266,+2]mm · 12 of 158 slices shown, 15 images]
[im 12/158  mediastinal]
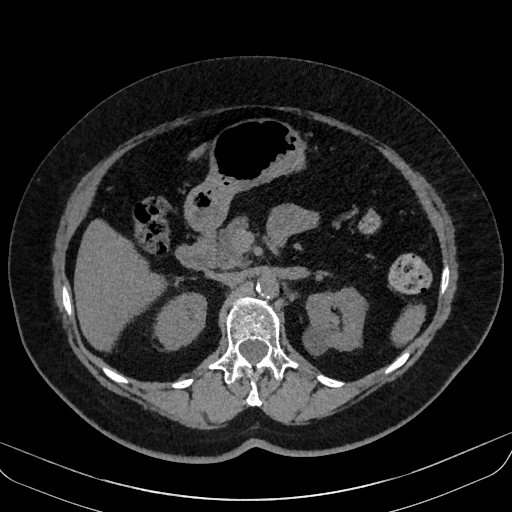
[im 12/158  lung]
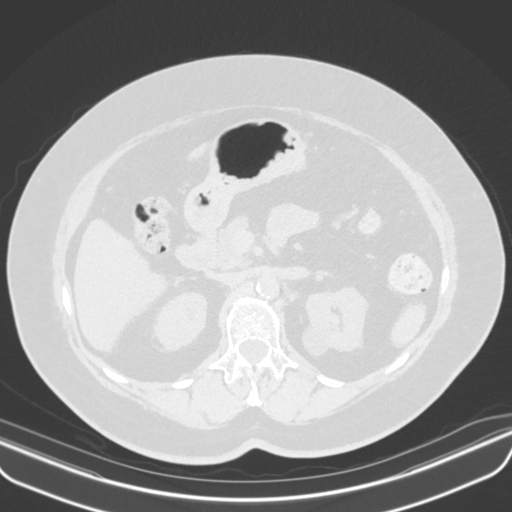
[im 24/158  lung]
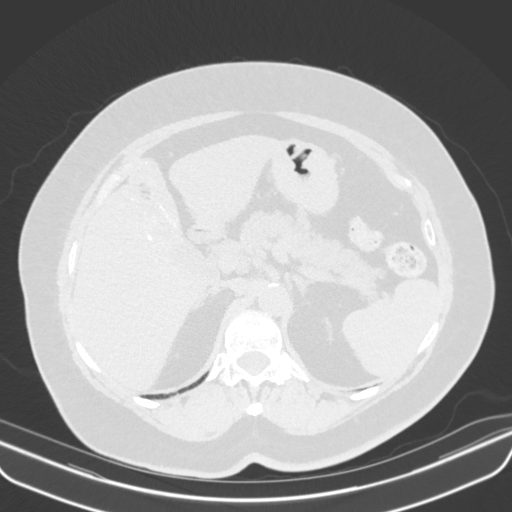
[im 35/158  lung]
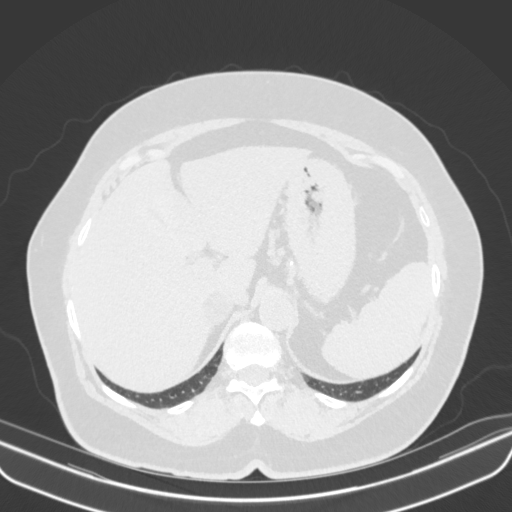
[im 47/158  lung]
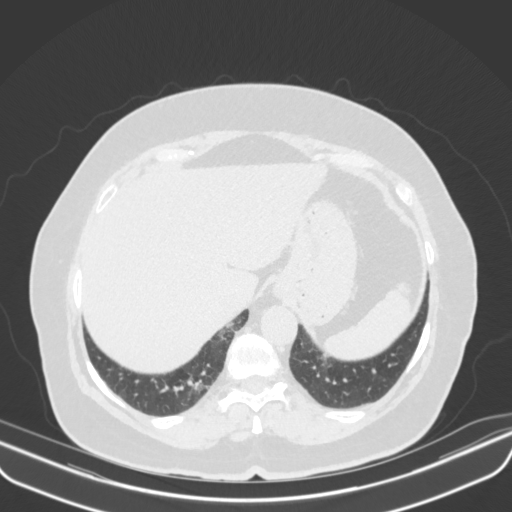
[im 59/158  mediastinal]
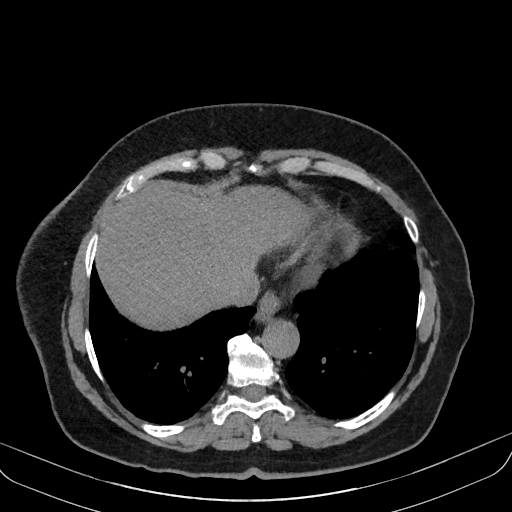
[im 59/158  lung]
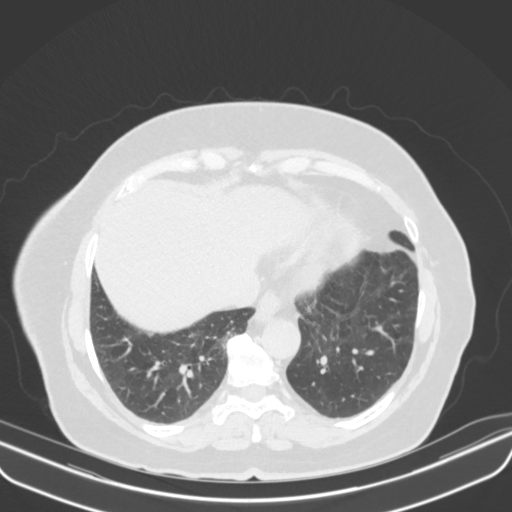
[im 70/158  lung]
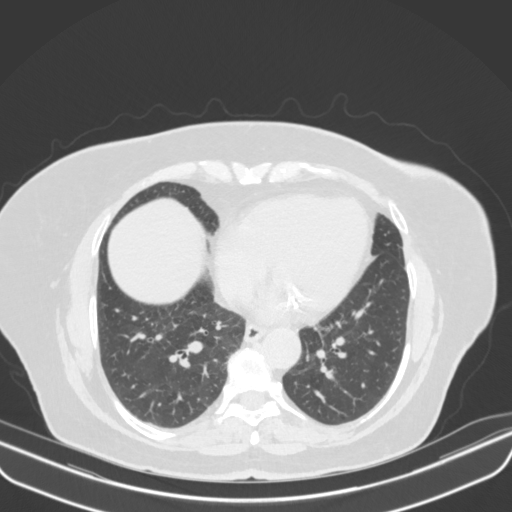
[im 88/158  lung]
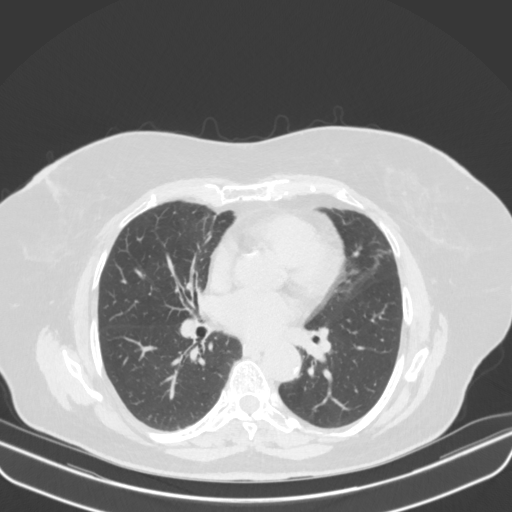
[im 99/158  lung]
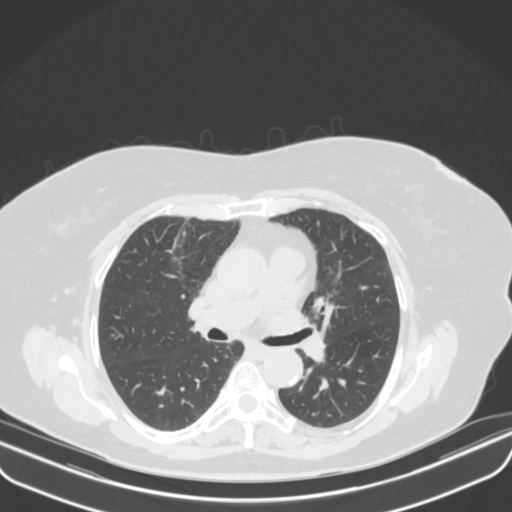
[im 111/158  mediastinal]
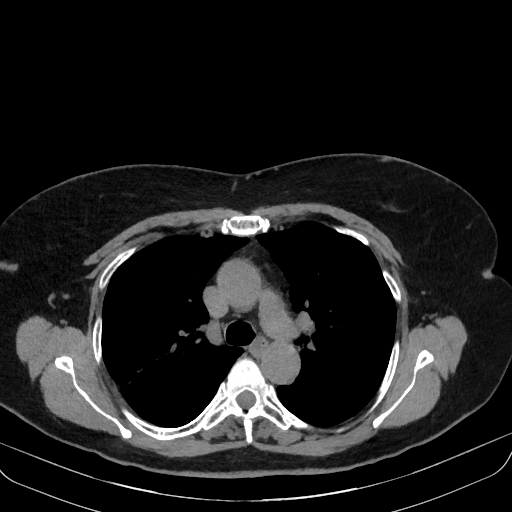
[im 111/158  lung]
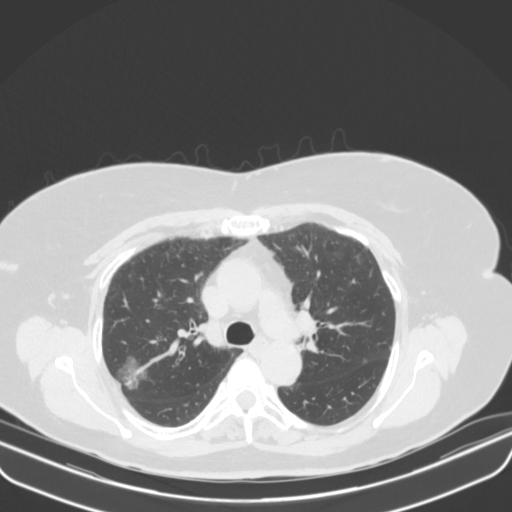
[im 123/158  lung]
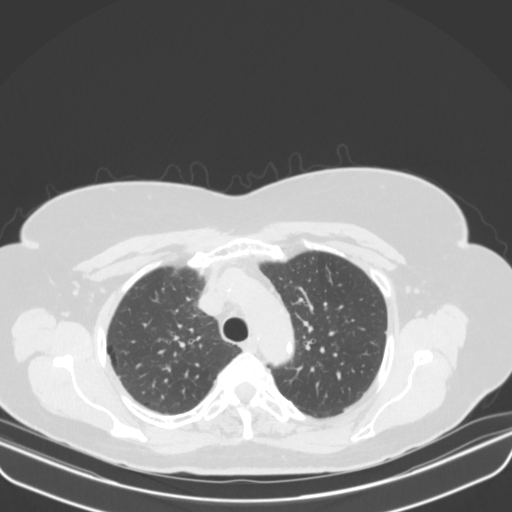
[im 134/158  lung]
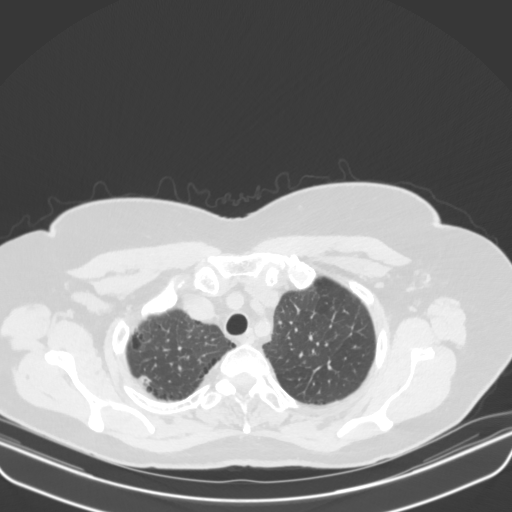
[im 146/158  lung]
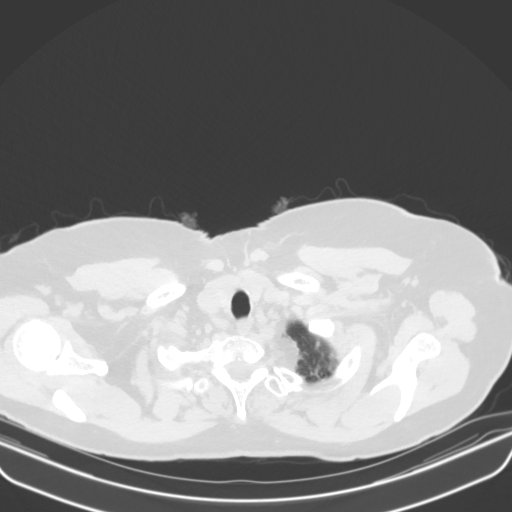

[Series 5: coronal · coronal · 0.63mm/px · 3 of 156 slices shown]
[im 32/156  lung]
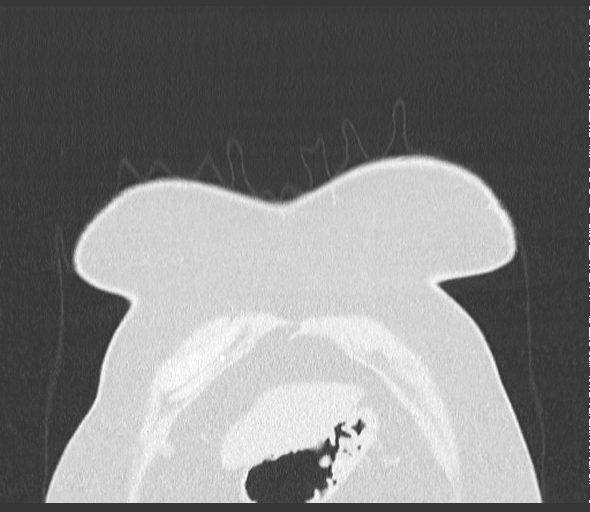
[im 63/156  lung]
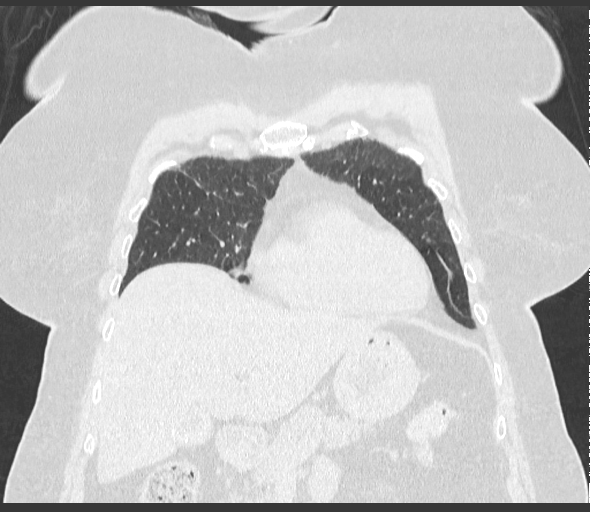
[im 94/156  lung]
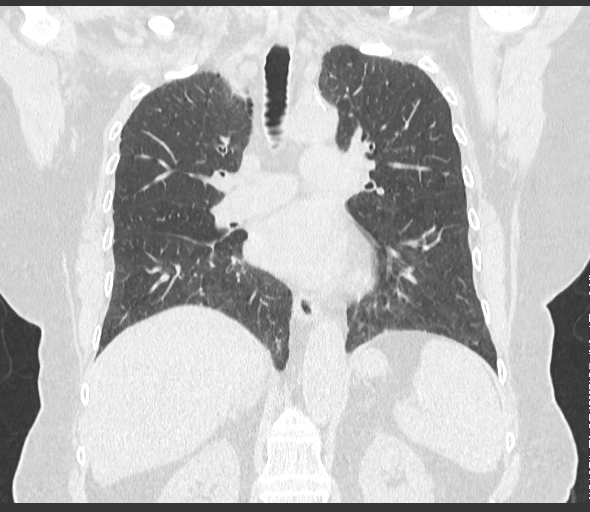

[15 of 36 positions shown; findings below may reference images not displayed]

FINDINGS: LUNGS AND PLEURA:  Paraseptal emphysematous changes. There is a groundglass 
lesion in the posterior aspect of the right upper lobe best seen on axial image 
48 measuring 23 x 21 mm. Along its superior aspect is a more solid component 
measuring up to 10 mm. No additional lesions are seen. Atelectatic changes are 
identified. Few scattered micronodules are seen none greater than 2 to 3 mm. No 
pleural effusion seen. 
MEDIASTINUM:  No mediastinal mass or adenopathy. Atherosclerotic changes with 
moderate to marked coronary calcifications. 
CHEST WALL/AXILLA: No mass or adenopathy.  
UPPER ABDOMEN: Low-density 2.4 cm right adrenal lesion thought to be consistent 
with a lipid rich adenoma. Cholelithiasis. Simple cyst left kidney. 
MUSCULOSKELETAL: Endplate changes and marginal osteophyte formation. No fracture 
or destructive changes seen.
IMPRESSION: Concerning right upper lobe lesion. Would be beneficial to compare to priors if 
they become available. Otherwise this is concerning for lepidic growth pattern 
of an adenocarcinoma. This would be amenable to percutaneous biopsy. 
Atherosclerotic changes and degenerative changes. Cholelithiasis. Right adrenal 
adenoma. 
RADIATION DOSE REDUCTION: All CT scans are performed using radiation dose 
reduction techniques, when applicable.  Technical factors are evaluated and 
adjusted to ensure appropriate moderation of exposure.  Automated dose 
management technology is applied to adjust the radiation doses to minimize 
exposure while achieving diagnostic quality images.

## 2020-11-07 IMAGING — MR MRI LUMBAR SPINE WITHOUT CONTRAST
4 of 7 series · 15 of 48 positions shown · IV contrast (gadolinium)
Comparison: Radiograph of 11/07/2020.

FINAL Diagnostic Imaging Report 
________________________________________________________________________________________________ 
MRI LUMBAR SPINE WITHOUT CONTRAST, 11/07/2020 [DATE]: 
CLINICAL INDICATION: Low back pain. Right-sided L4-L5 spasms with a.m. stiffness 
and claudication.
TECHNIQUE: Multiplanar, multiecho position MR images of the lumbar spine were 
performed without intravenous gadolinium enhancement. Patient was scanned on a 
3T magnet.

[Series 101: survey · axial · 10.0mm · 1.39mm/px · z∈[-15,+199]mm · 3 of 9 slices shown]
[im 1/9]
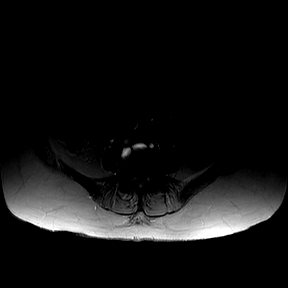
[im 6/9]
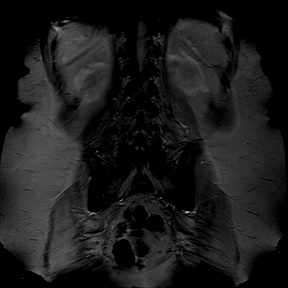
[im 9/9]
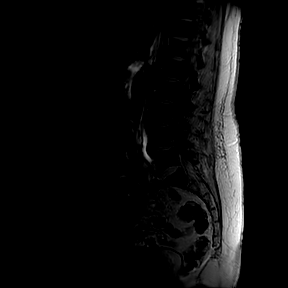

[Series 201: t2w_cor-surv · coronal · 6.0mm · 0.50mm/px · 2 of 5 slices shown]
[im 1/5]
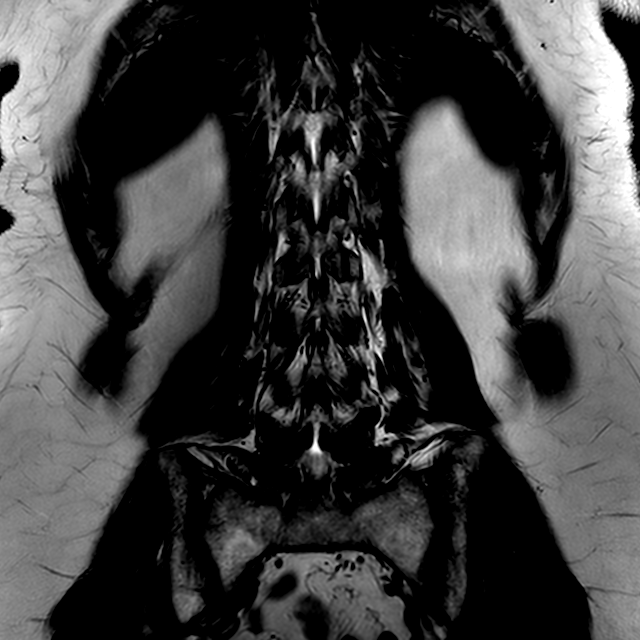
[im 5/5]
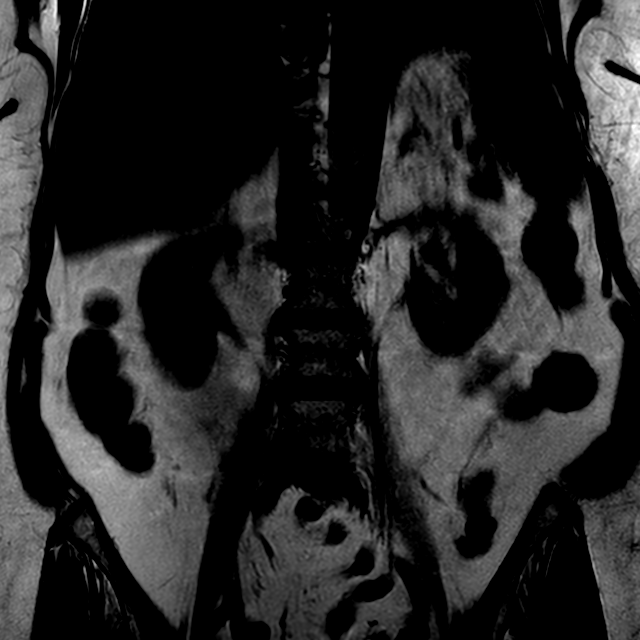

[Series 301: t2w_tse sag · sagittal · 4.0mm · 0.33mm/px · 3 of 17 slices shown]
[im 4/17]
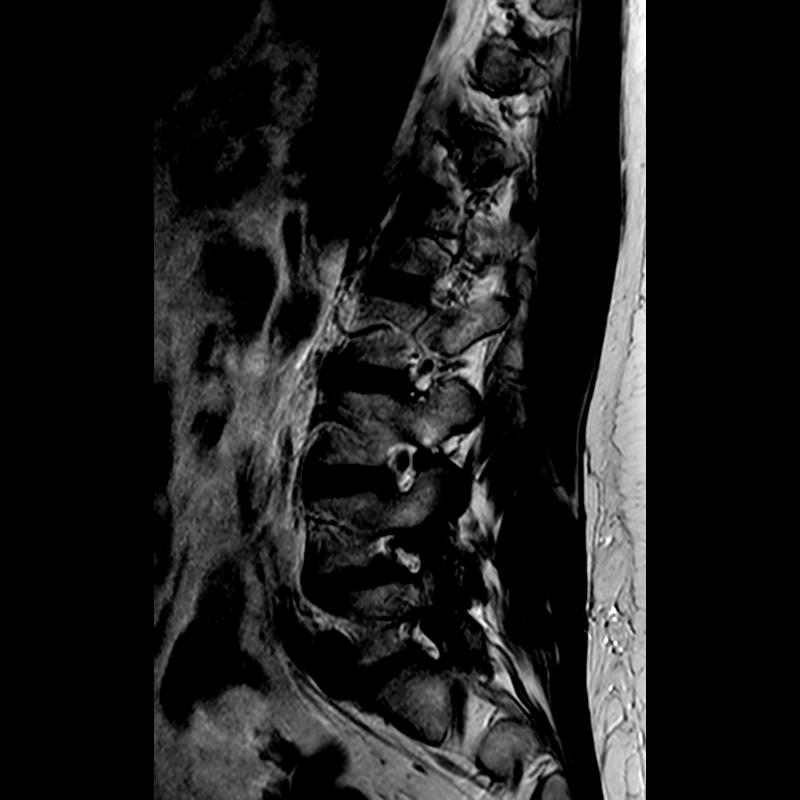
[im 10/17]
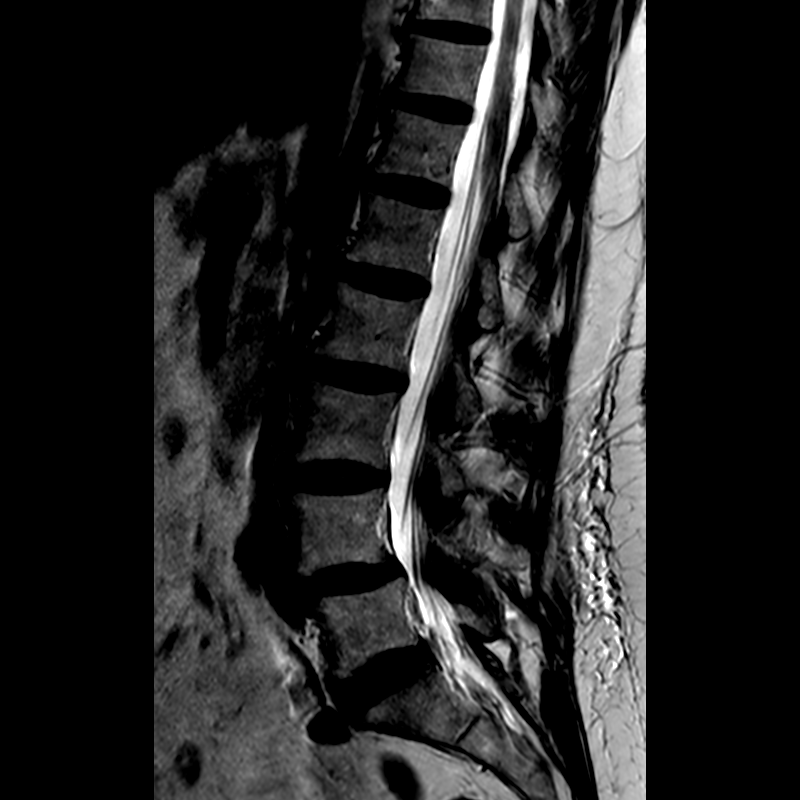
[im 17/17]
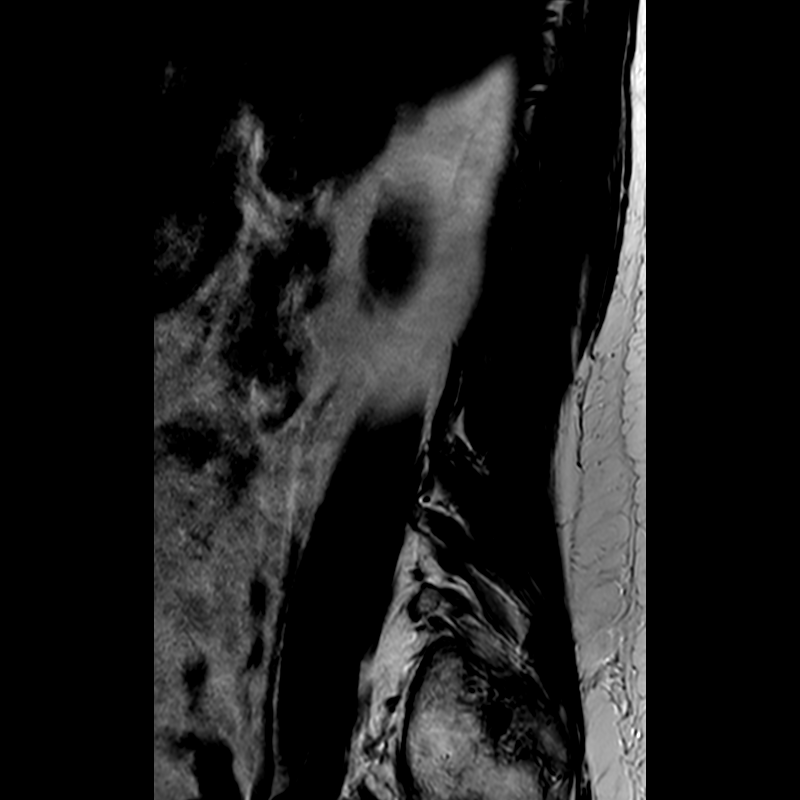

[Series 701: T1 · axial · 4.0mm · 0.35mm/px · z∈[-78,+97]mm · 7 of 35 slices shown]
[im 1/35]
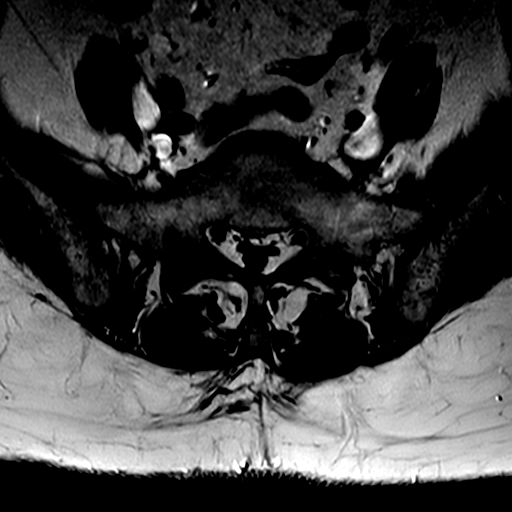
[im 6/35]
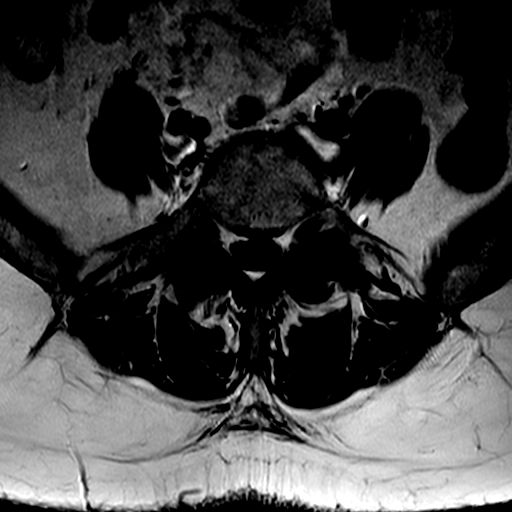
[im 12/35]
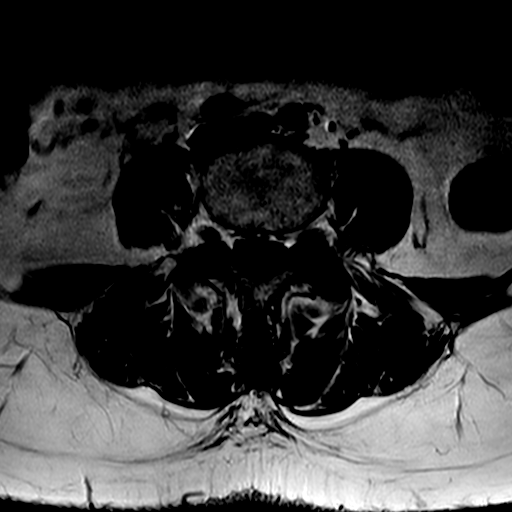
[im 15/35]
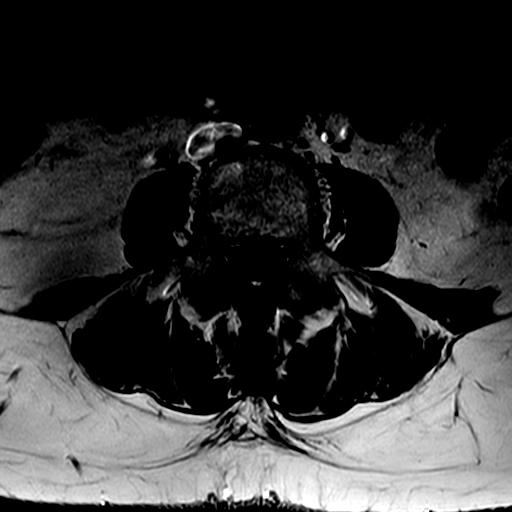
[im 18/35]
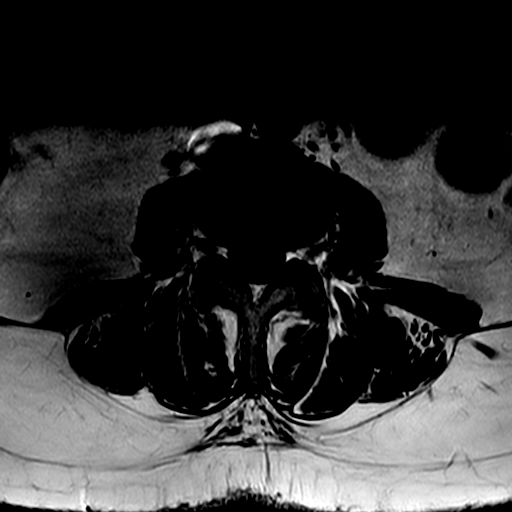
[im 20/35]
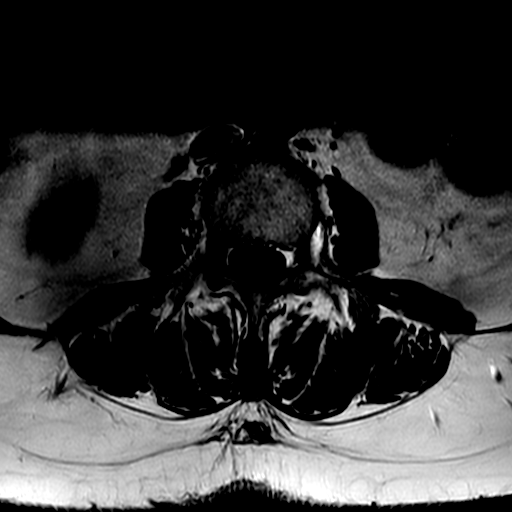
[im 29/35]
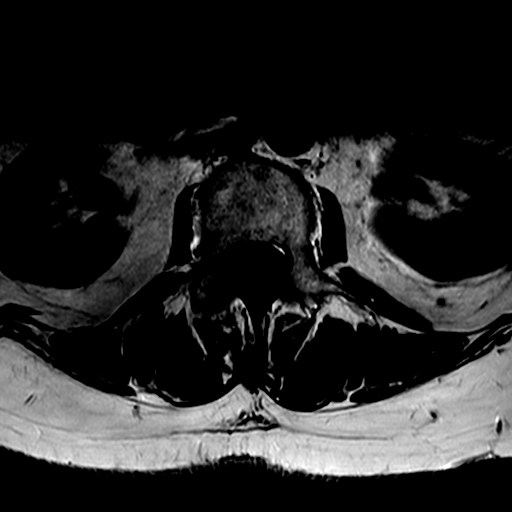

[15 of 48 positions shown; findings below may reference images not displayed]

FINDINGS: There are 5 lumbar-type vertebral bodies. Grade 1 anterolisthesis L4 
on L5. No discrete pars defect. There is slight marrow edema within the 
bilateral L5 pars interarticularis and pedicles. No acute vertebral body 
fracture. Hemangioma is present within T12. The conus tip terminates at the L1 
vertebral body level. The aorta is normal in diameter. The posterior paraspinal 
musculature is symmetric. 
Modic I-II: None. 
Ligamentum Flavum > 2.5 mm: All levels 
T12-L1: The disc is normal in height and signal. No disc herniation. Normal 
facets. No spinal canal or neural foraminal stenosis. 
L1-L2: The disc is normal in height and signal. No disc herniation. Normal 
facets. No spinal canal or neural foraminal stenosis. 
L2-L3: Mild disc desiccation without disc height loss. No disc herniation. 
Normal facets. No spinal canal or neural foraminal stenosis. 
L3-L4: Mild disc desiccation without disc height loss No disc herniation. Normal 
facets. No spinal canal or neural foraminal stenosis. 
L4-L5: Mild disc desiccation and disc height loss. There is anterolisthesis with 
partial unroofing of the disc. Moderate facet hypertrophy. Moderate left greater 
than right neural foraminal stenosis. There is left greater than right lateral 
recess stenosis with abutment of the left greater than right traversing L5 nerve 
roots. Mild spinal canal stenosis. 
L5-S1: Mild disc desiccation without disc height loss. Facet hypertrophy. 
Moderate bilateral left greater than right neural foraminal stenoses. Mild left 
lateral recess stenosis.
IMPRESSION: 1.  Spondylotic changes lower lumbar spine. 
2.  There is facet arthropathy greatest at L4-L5 with anterolisthesis, neural 
foraminal, lateral recess and spinal canal stenoses with potential for neural 
impingement.

## 2020-11-07 IMAGING — DX LUMBAR SPINE COMPLETE 4 VIEWS
1 series · 4 of 4 positions shown · non-contrast
Comparison: None prior of the lumbar spine.

FINAL Diagnostic Imaging Report 
________________________________________________________________________________________________ 
LUMBAR SPINE COMPLETE 4 VIEWS, 11/07/2020 [DATE]: 
CLINICAL INDICATION: Low back pain. Stiffness. L4-L5 spasms and a.m. stiffness.

[Series 1: AP · U · 0.14mm/px · 4 of 4 slices shown]
[im 1/4]
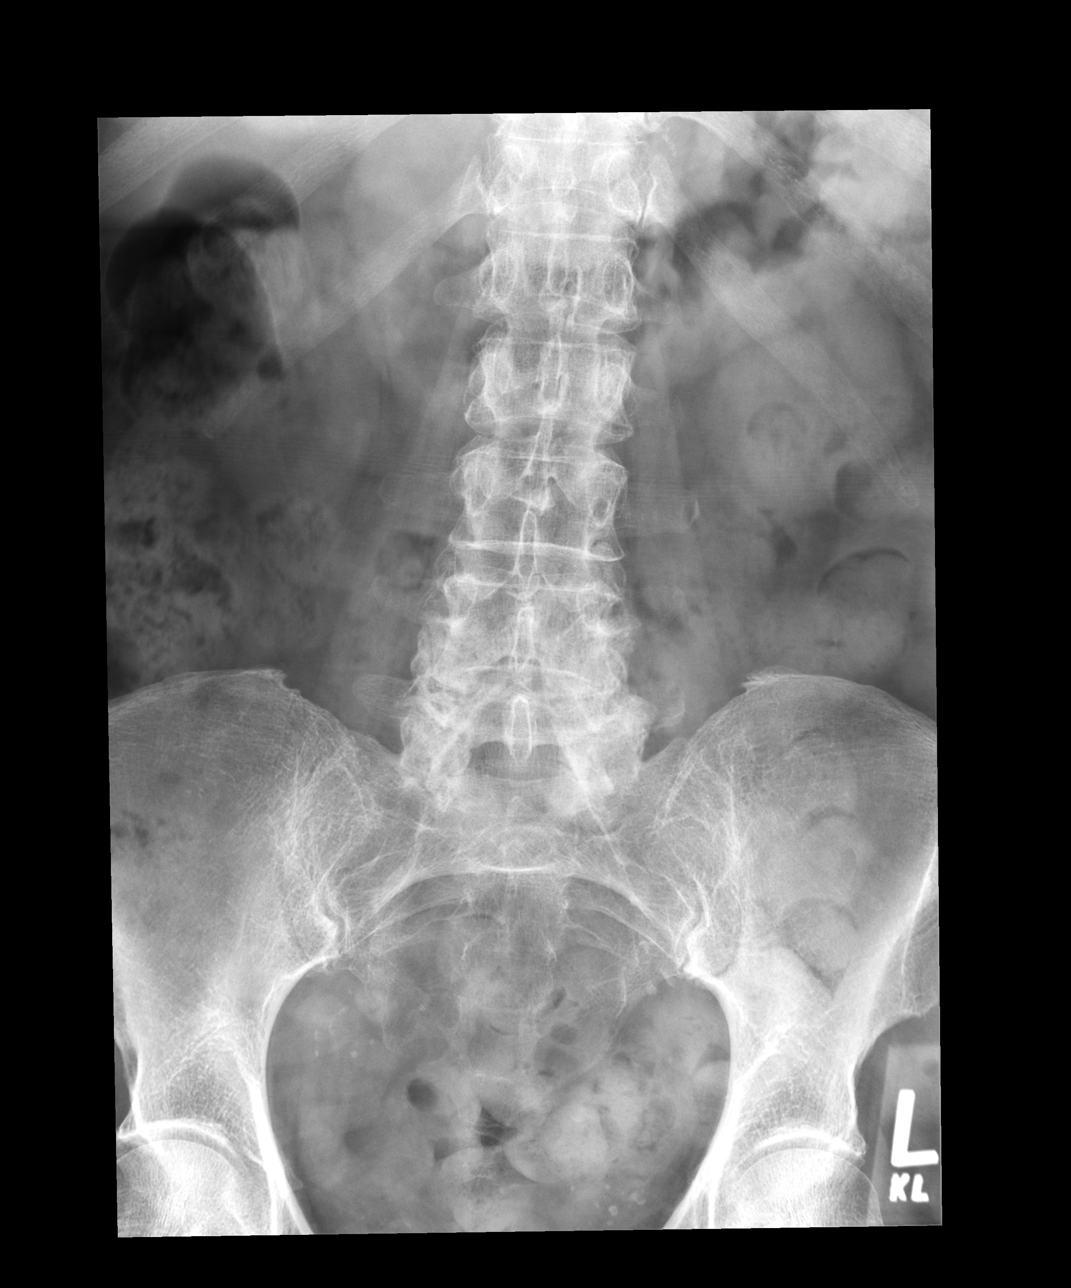
[im 2/4]
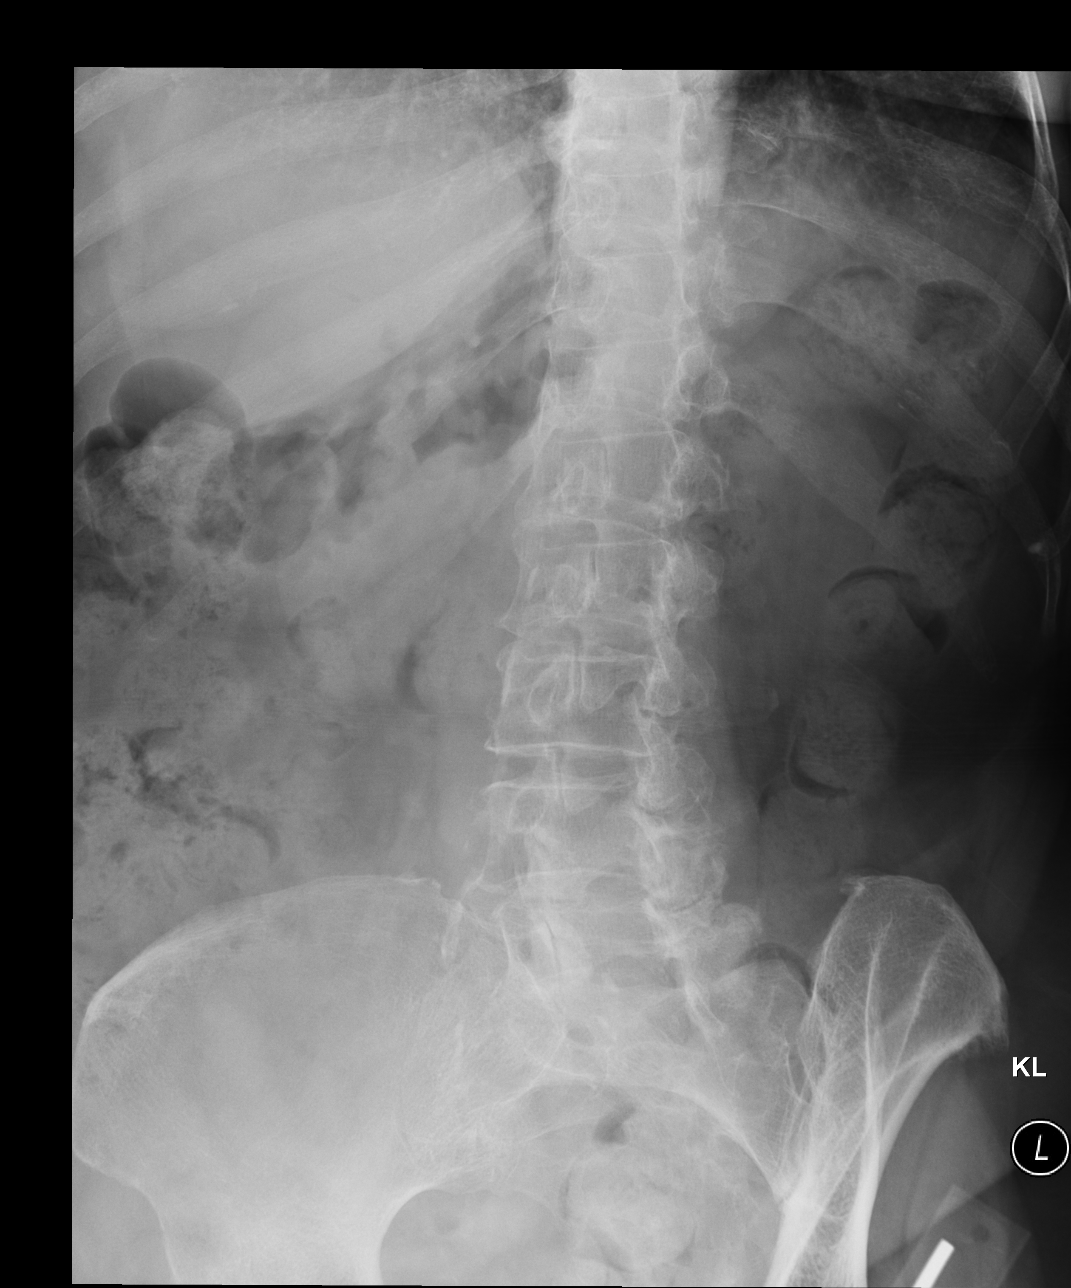
[im 3/4]
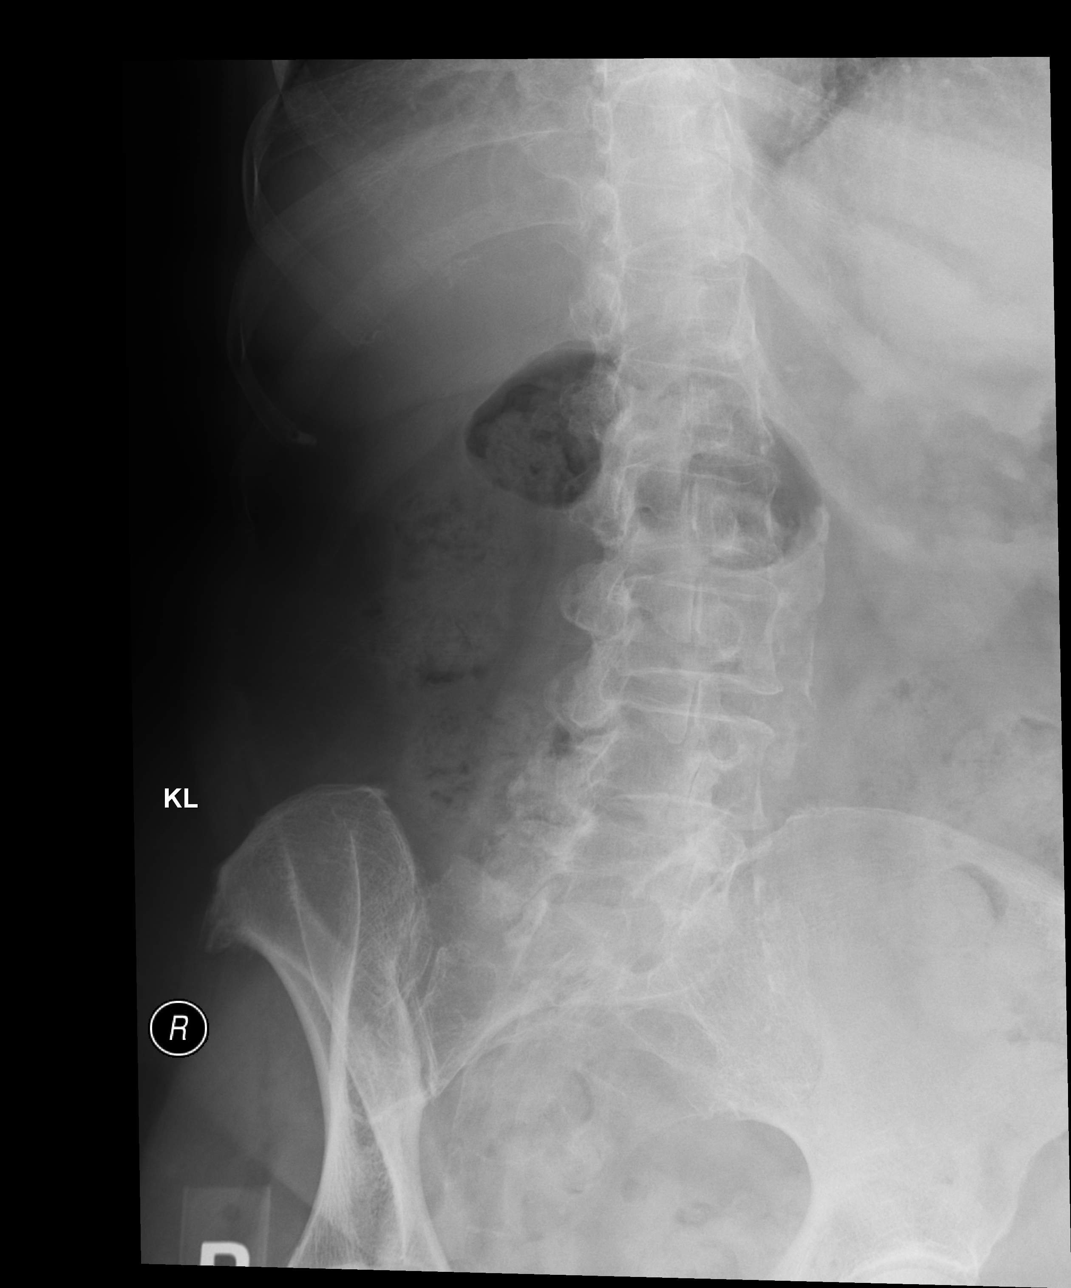
[im 4/4]
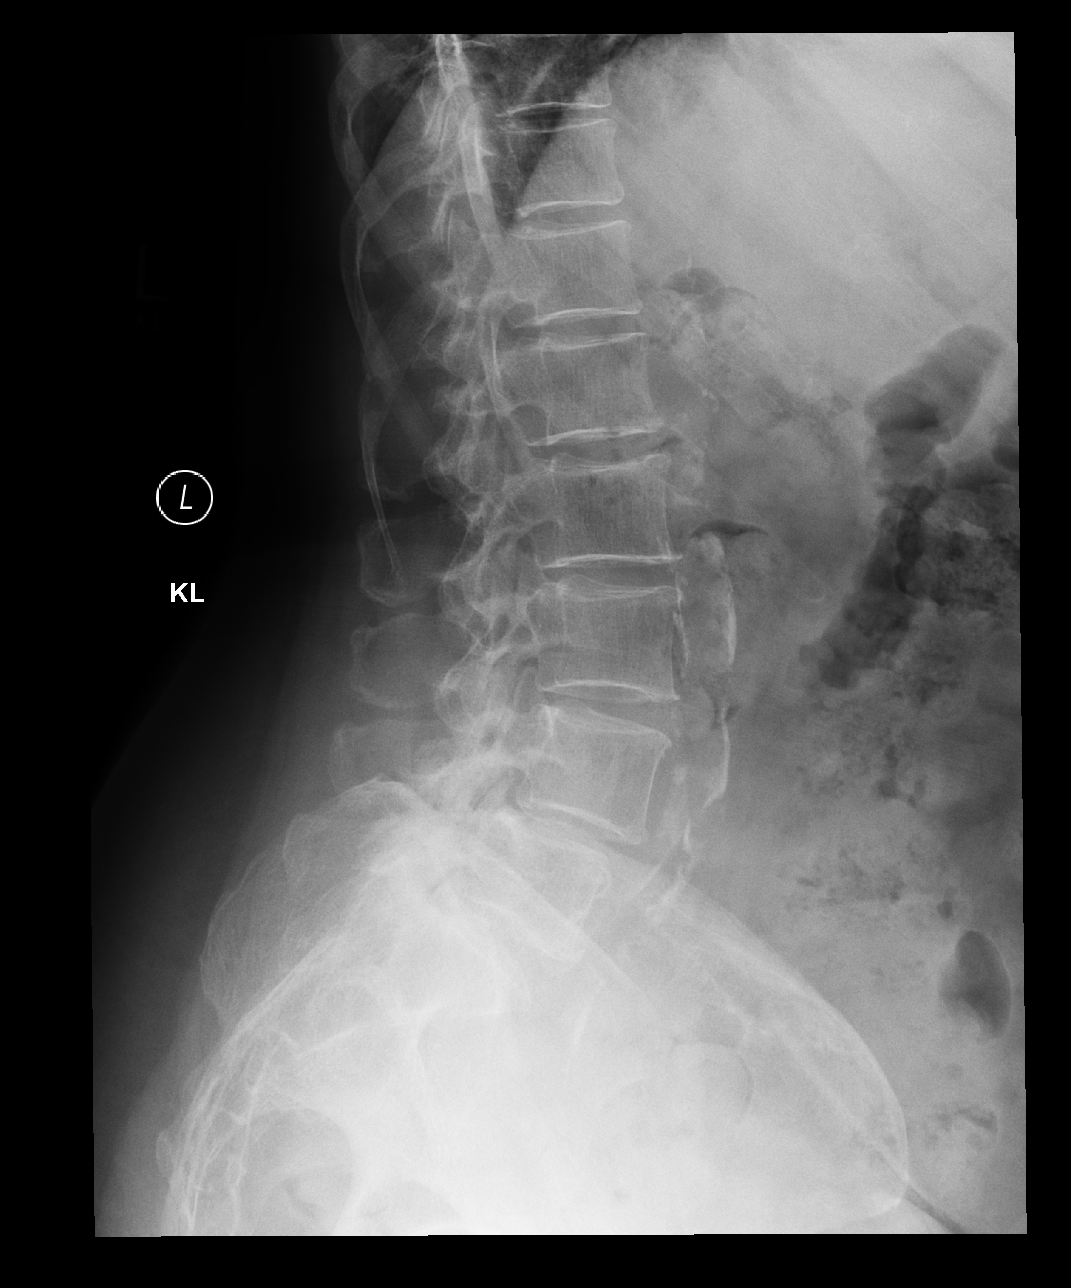

[4 of 4 positions shown; findings below may reference images not displayed]

FINDINGS: There are 5 lumbar-type vertebral bodies. Scattered disc space 
narrowing and osteophytic spurring. There is lower lumbar facet hypertrophy. No 
vertebral body fracture. No spondylolisthesis. Degenerative changes SI joints. 
Scattered vascular calcifications. Mild prominence of stool within the colon.
IMPRESSION: Moderately advanced spondylotic changes lumbar spine. 
Constipation.

## 2021-01-27 IMAGING — MG MAMMOGRAPHY SCREENING BILATERAL 3[PERSON_NAME]
8 series · 8 of 24 positions shown · non-contrast
Comparison: Delay in the report was to obtain prior mammograms for comparison.

________________________________________________________________________________________________ 
MAMMOGRAPHY SCREENING BILATERAL 3DAIZY THAKURGAON, 01/27/2021 [DATE]: 
CLINICAL INDICATION: Screening. History of prior benign left breast biopsy.
TECHNIQUE: Digital bilateral mammograms and 3-D Tomosynthesis were obtained. 
These were interpreted both primarily and with the aid of computer-aided 
detection system.  
BREAST DENSITY: (Level A) The breasts are almost entirely fatty.

[R MLO]
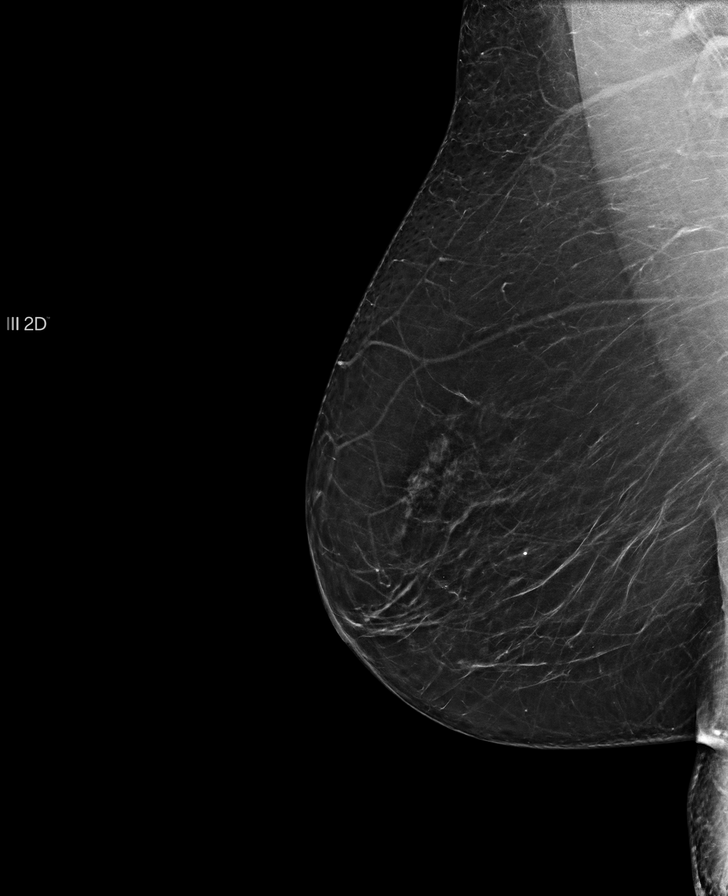

[L MLO]
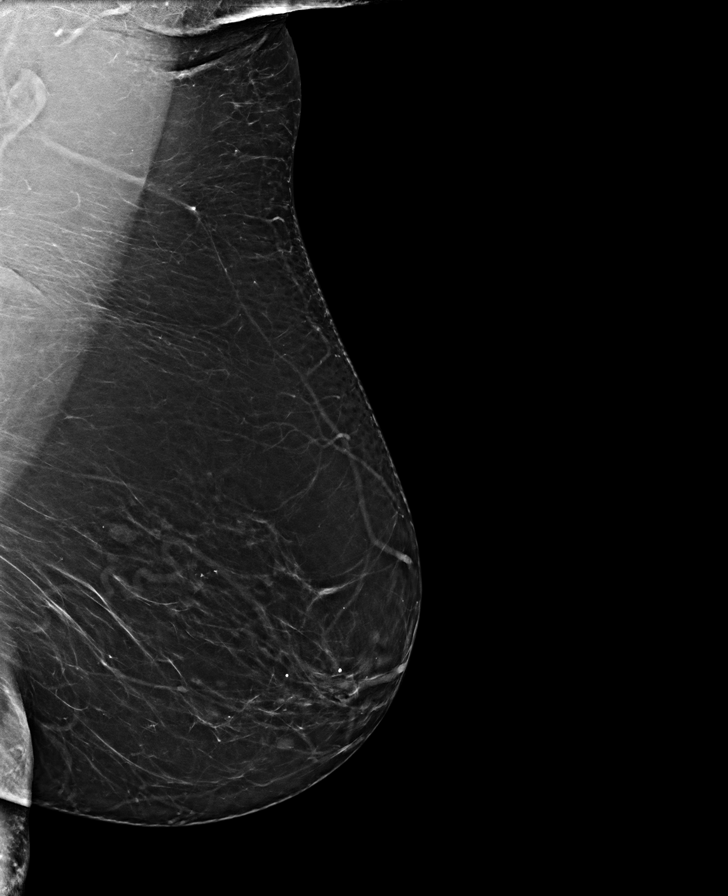

[L CC]
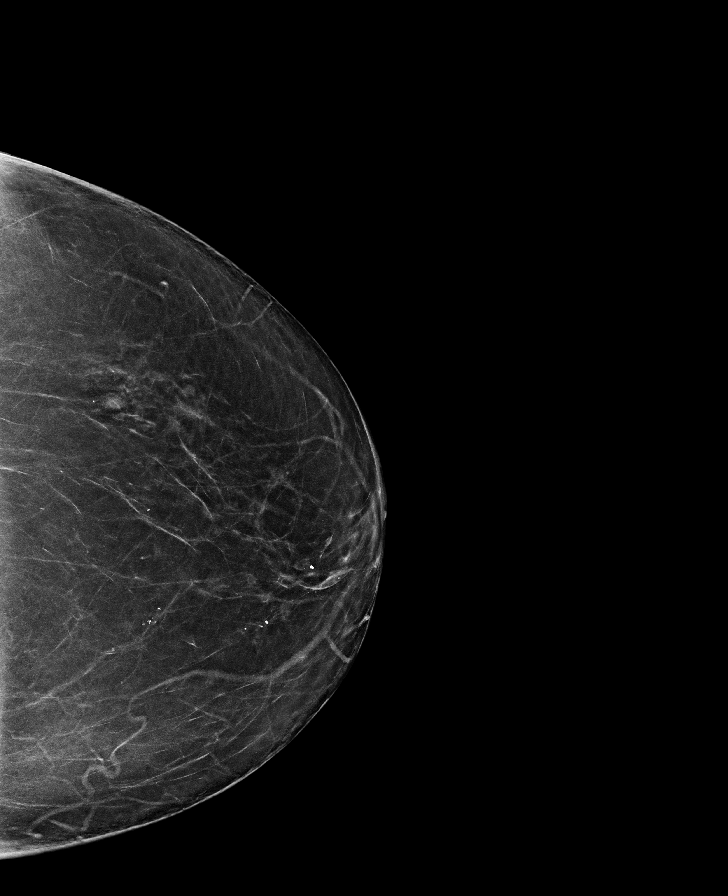

[R CC]
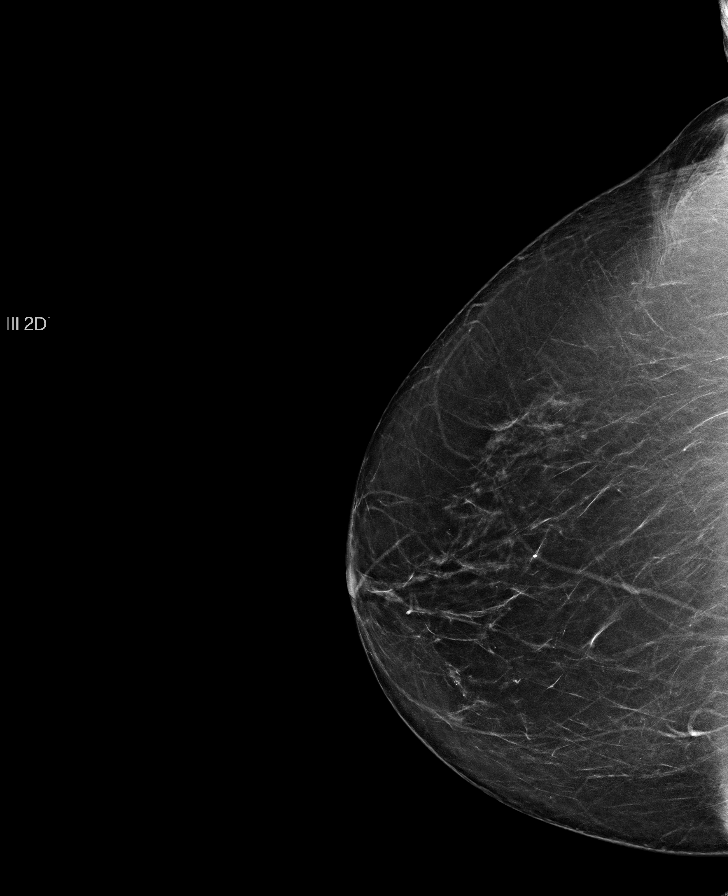

[R MLO tomo · tomo slice 39/76.0]
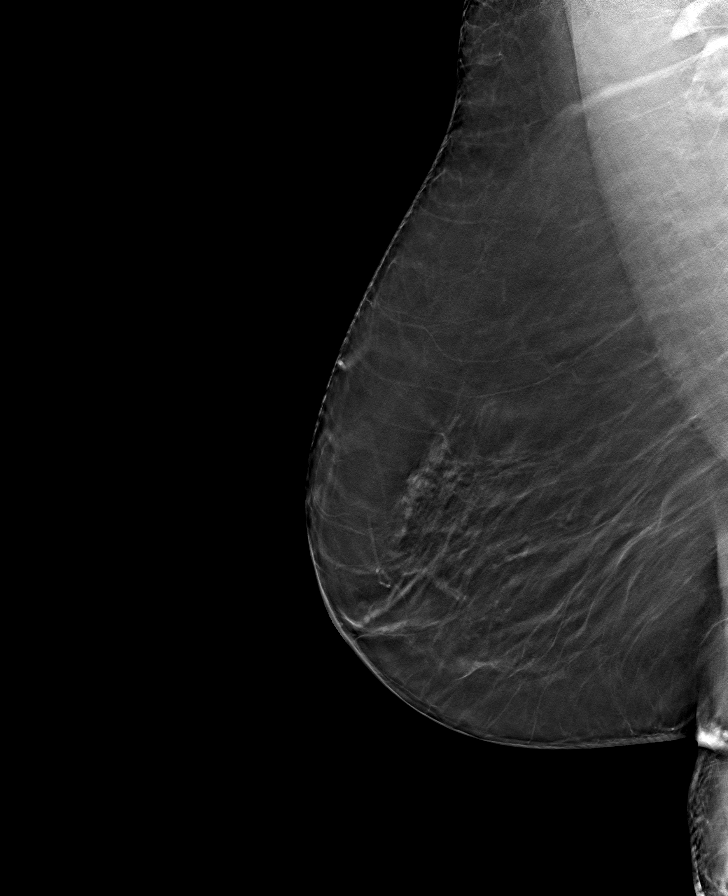

[L CC tomo · tomo slice 35/69.0]
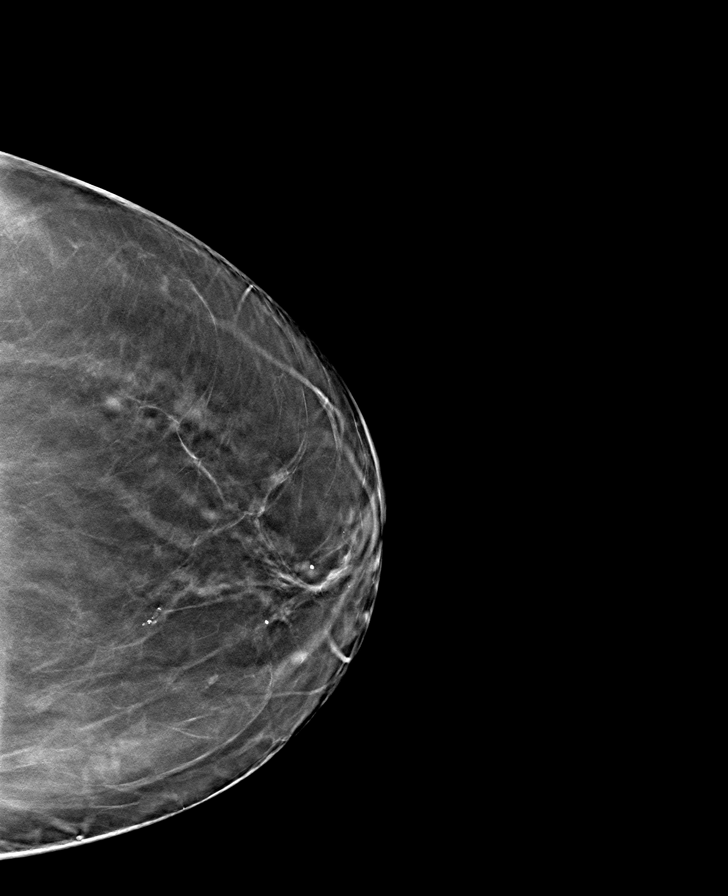

[L MLO tomo · tomo slice 41/81.0]
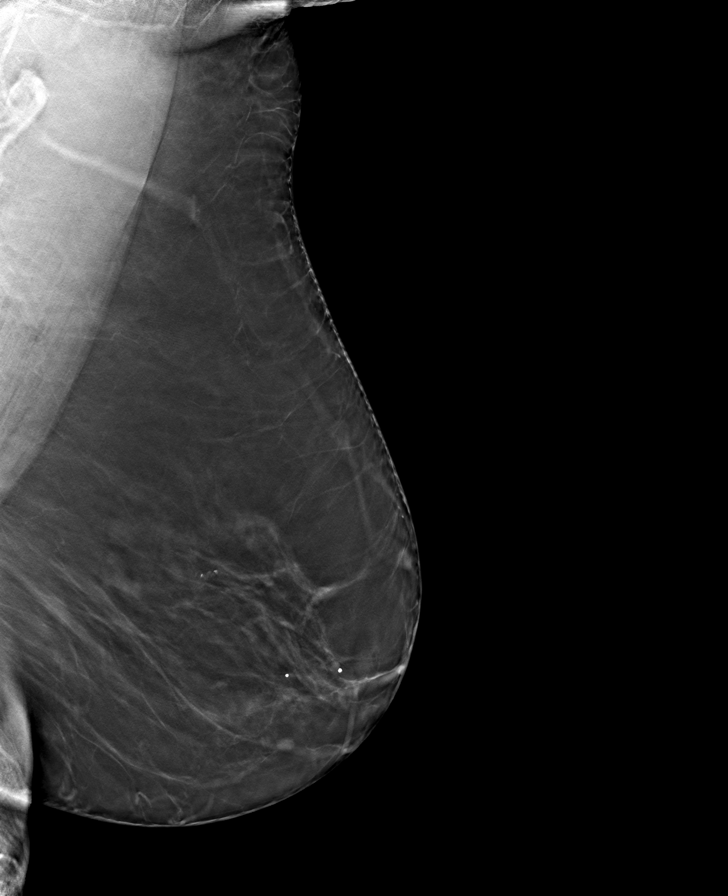

[R CC tomo · tomo slice 37/73.0]
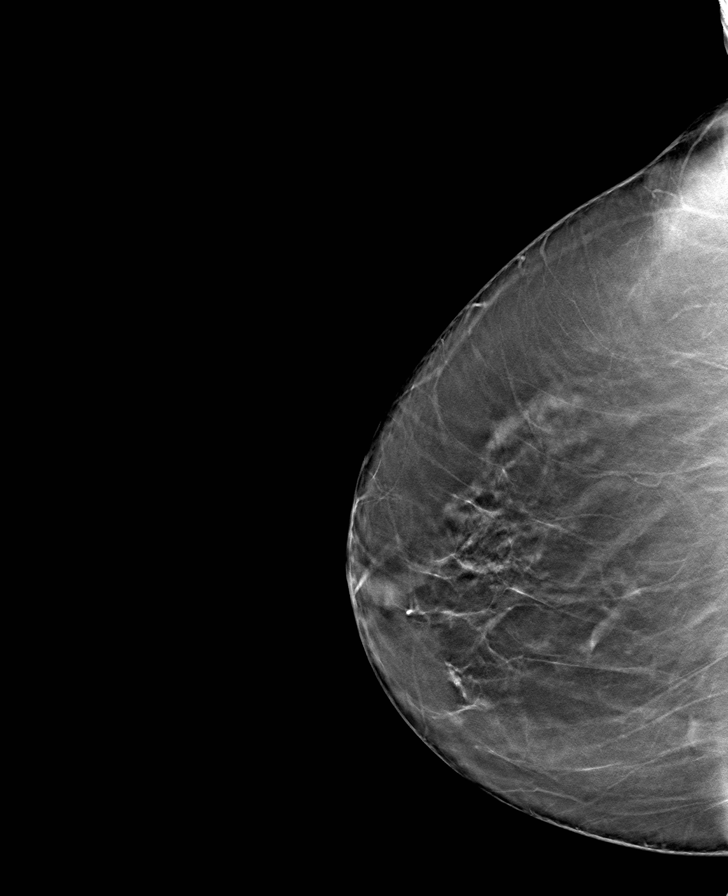

[8 of 24 positions shown; findings below may reference images not displayed]

Prior mammograms remain unavailable for comparison at the time of this 
dictation.
FINDINGS: No suspicious mass, calcifications, or area of architectural 
distortion in either breast.
IMPRESSION: (BI-RADS 2) Benign findings. Routine mammographic follow-up is recommended. 
(BI-RADS 2) Benign findings. Routine mammographic follow-up is recommended.

## 2021-11-24 IMAGING — CT CT CHEST/ABDOMEN WITHOUT CONTRAST
2 of 7 series · 15 of 46 positions shown, 17 images · non-contrast
Comparison: There are no prior exam(s) available for comparison within the past 
12 months; however, comparison was made to the prior exam(s) 11/24/2021.

________________________________________________________________________________________________ 
CT CHEST/ABDOMEN WITHOUT CONTRAST, 11/24/2021 [DATE]: 
CLINICAL INDICATION: Benign neoplasm of right adrenal gland. 
A search for DICOM formatted images was conducted for prior CT imaging studies 
completed at a non-affiliated media free facility.
TECHNIQUE: The chest and abdomen were scanned from base of neck through the 
aortic bifurcation without contrast on a high resolution low dose CT scanner. 
Routine MPR and MIP 3D renderings were reconstructed on an independent 
workstation with concurrent physician supervision.

[Series 2: chst/ab w/o 3.0 i31s 2 · axial · non-contrast · 0.70mm/px · z∈[-405,-42]mm · 12 of 144 slices shown, 14 images]
[im 12/144  soft-tissue]
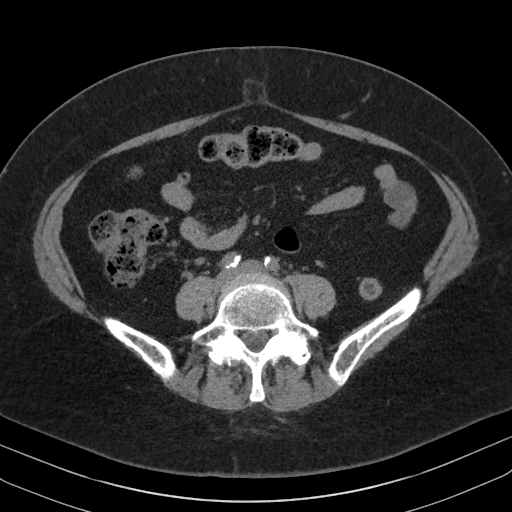
[im 12/144  bone]
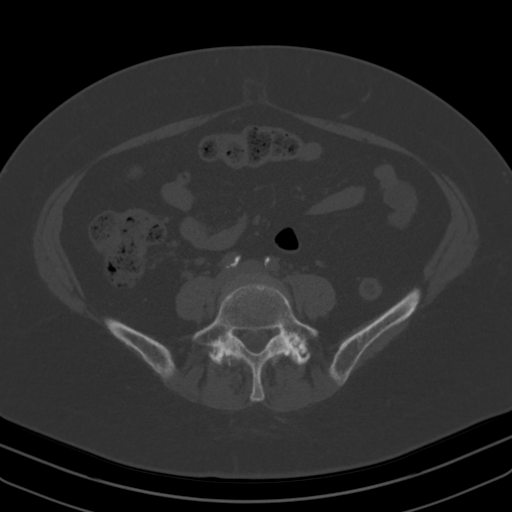
[im 23/144  soft-tissue]
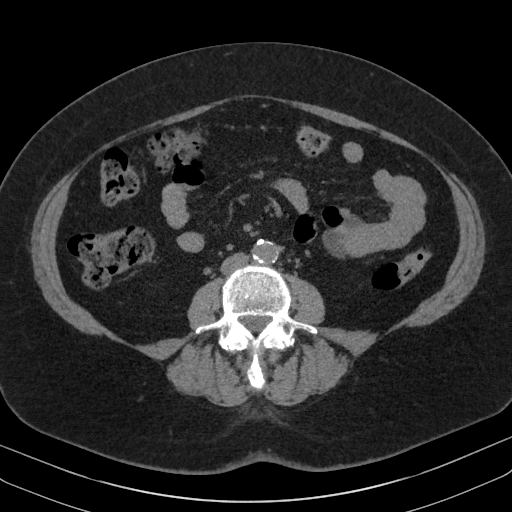
[im 34/144  soft-tissue]
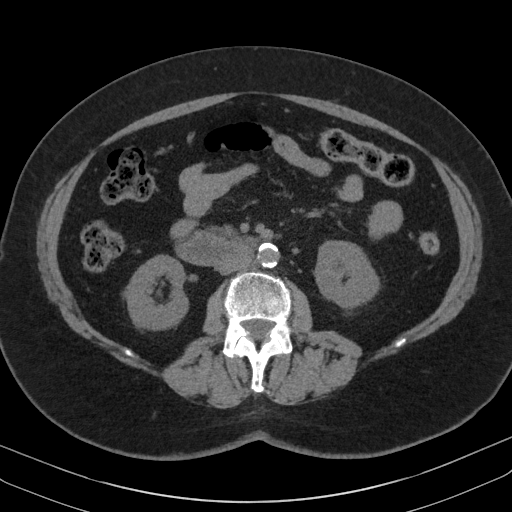
[im 45/144  soft-tissue]
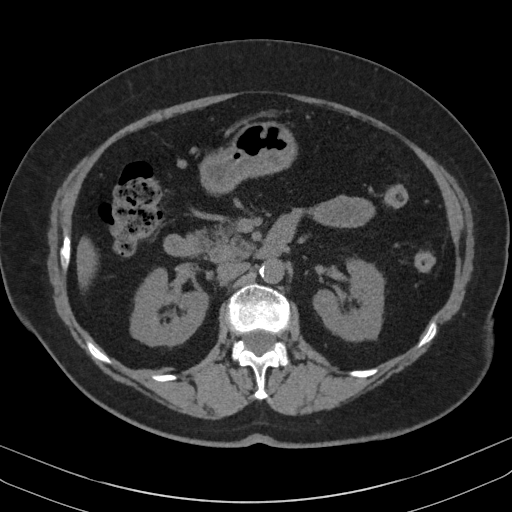
[im 56/144  soft-tissue]
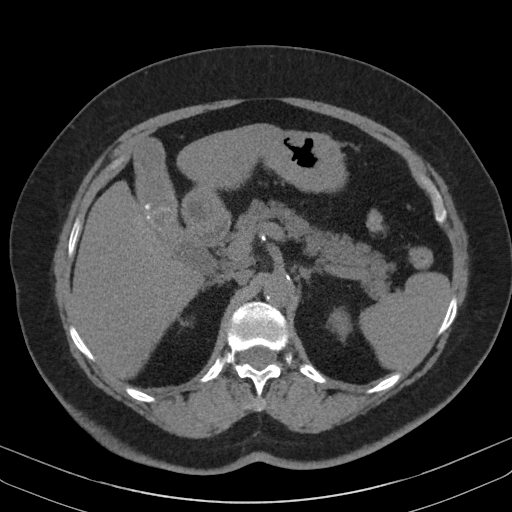
[im 67/144  soft-tissue]
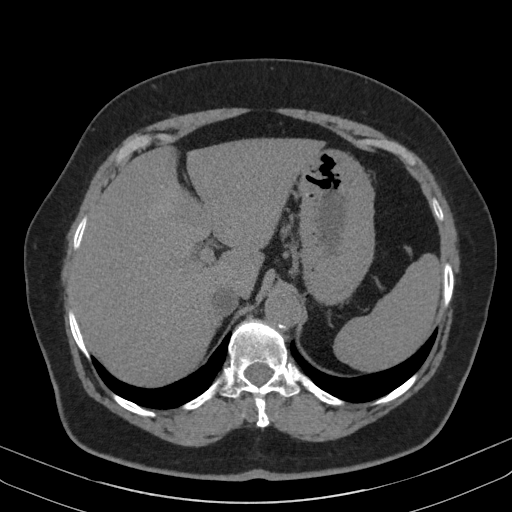
[im 78/144  soft-tissue]
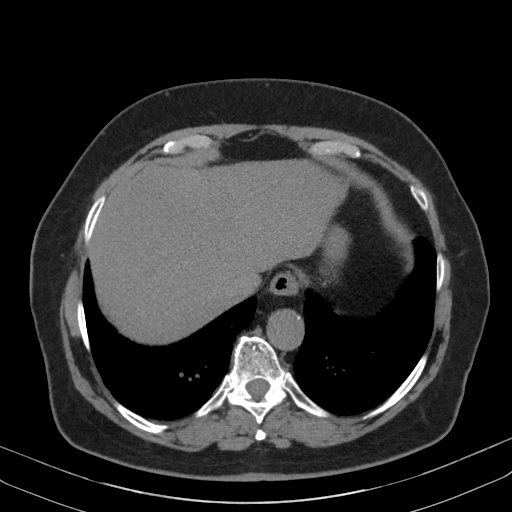
[im 89/144  soft-tissue]
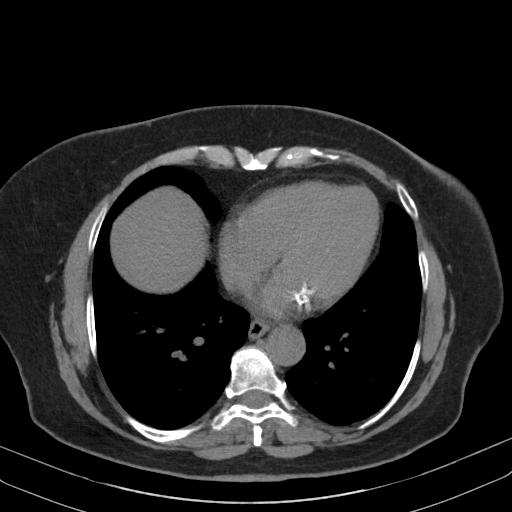
[im 100/144  soft-tissue]
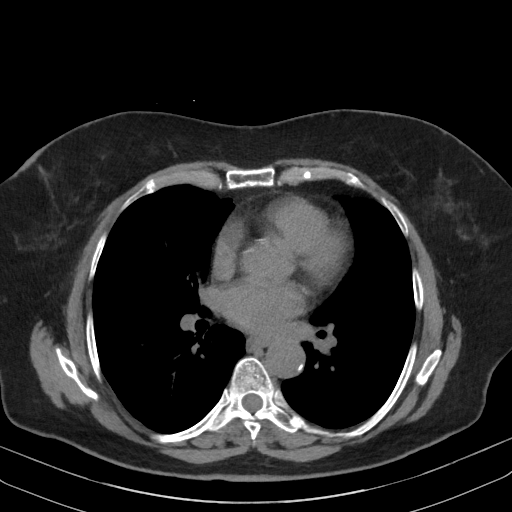
[im 100/144  bone]
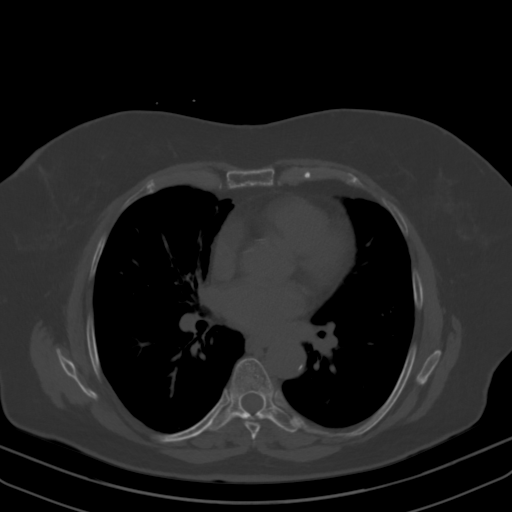
[im 111/144  soft-tissue]
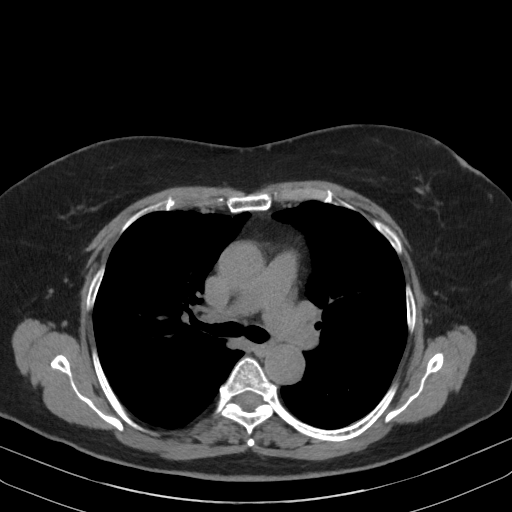
[im 122/144  soft-tissue]
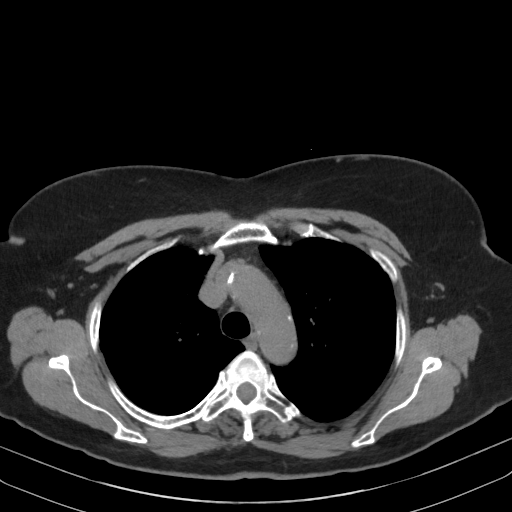
[im 133/144  soft-tissue]
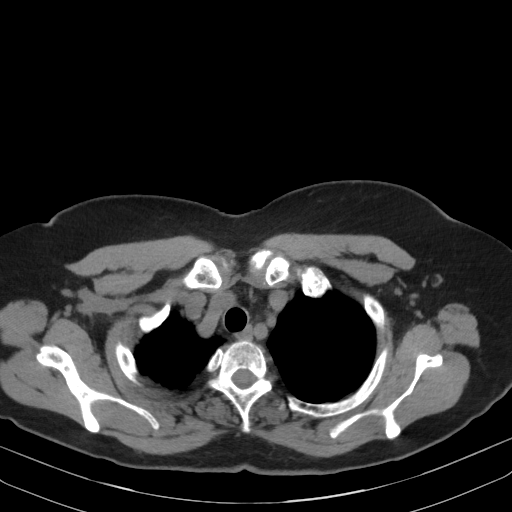

[Series 8: cor from thins · coronal · 0.58mm/px · 3 of 156 slices shown]
[im 39/156  soft-tissue]
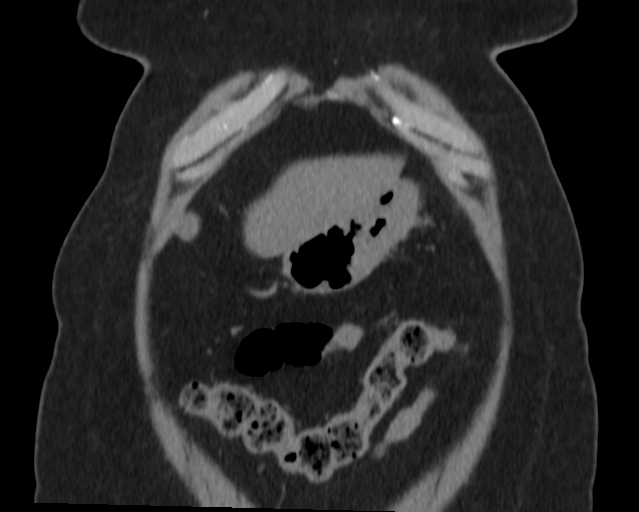
[im 78/156  soft-tissue]
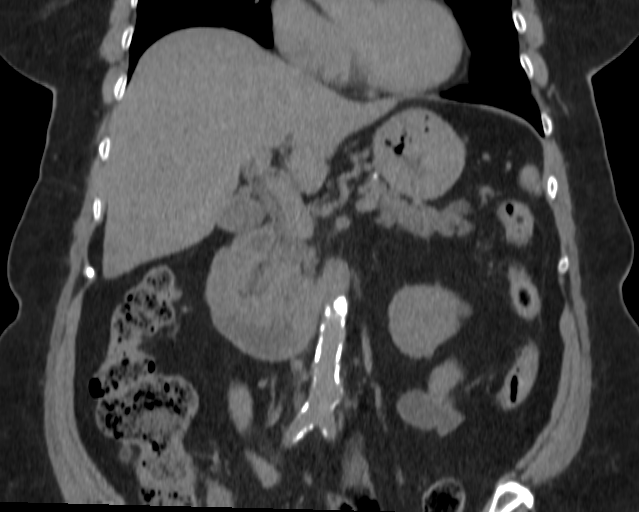
[im 117/156  soft-tissue]
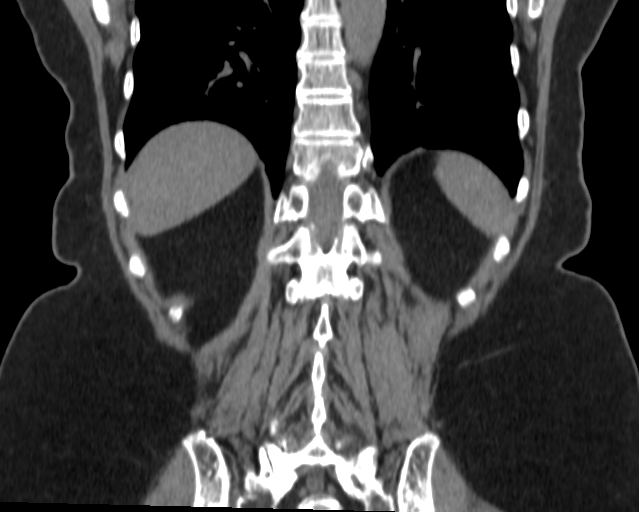

[15 of 46 positions shown; findings below may reference images not displayed]

FINDINGS: LUNGS AND PLEURA:  Groundglass density again seen in the right upper lobe 
measuring 2.3 x 2.1 cm with solid component measuring up to 9 mm, not 
significantly changed when compared to previous studies dating back to 4444. 
Stable 2 mm peripheral nodule in the left lower lobe as seen on axial image #65 
of series #3. Mild stable scarring lungs without new mass or consolidations. No 
new pulmonary nodules. Mild paraseptal emphysematous changes. 
MEDIASTINUM:  No adenopathy. Normal heart size. No pericardial effusion. 
Moderate coronary artery calcifications. 
CHEST WALL/AXILLA: No mass or adenopathy.  
HEPATOBILIARY: Non contrast images show no mass or biliary dilatation. Multiple 
gallstones in gallbladder without inflammatory changes. 
SPLEEN: Normal in size. 
PANCREAS: No ductal dilatation or mass.   
ADRENALS: Stable low-density fullness in the left adrenal with Hounsfield units 
of +9 and measuring 1.9 x 1.3 cm and abutting the liver compatible with a right 
adrenal adenoma. Left adrenal is normal. 
KIDNEYS: No evidence for stones or hydronephrosis. Left renal cysts measuring up 
to 2.4 cm x 1.7 cm. 
LYMPH NODES: No adenopathy. 
STOMACH, SMALL BOWEL AND COLON: No bowel wall thickening or obstruction. 
VASCULAR STRUCTURES: No aneurysm.  
MUSCULOSKELETAL: No acute osseous abnormality. Scattered degenerative changes.  
ADDITIONAL FINDINGS: None.
IMPRESSION: Stable groundglass density in the right upper lobe, as this is unchanged when 
compared to previous CT and minimal change dating back to 4444 is again thought 
to likely be benign and would recommend continued follow-up in one year to 
ensure stability. 
Cholelithiasis. 
Stable right adrenal adenoma. 
No new mass in the abdomen or chest. 
RADIATION DOSE REDUCTION: All CT scans are performed using radiation dose 
reduction techniques, when applicable.  Technical factors are evaluated and 
adjusted to ensure appropriate moderation of exposure.  Automated dose 
management technology is applied to adjust the radiation doses to minimize 
exposure while achieving diagnostic quality images.

## 2022-03-17 IMAGING — MG MAMMOGRAPHY SCREENING BILATERAL 3[PERSON_NAME]
8 series · 8 of 24 positions shown · non-contrast
Comparison: Comparison was made to prior examinations.

________________________________________________________________________________________________ 
MAMMOGRAPHY SCREENING BILATERAL 3BALLWEIN MATUSCHEK, 03/17/2022 [DATE]: 
CLINICAL INDICATION: Screening. History of negative LEFT breast biopsy.
TECHNIQUE: Digital bilateral mammograms and 3-D Tomosynthesis were obtained. 
These were interpreted both primarily and with the aid of computer-aided 
detection system.  
BREAST DENSITY: (Level A) The breasts are almost entirely fatty.

[L MLO]
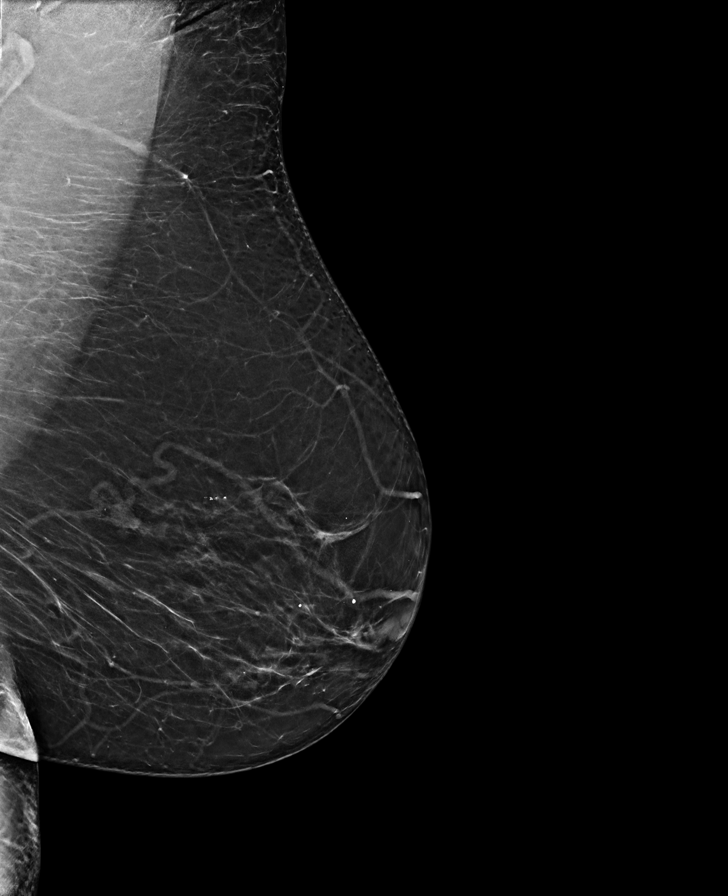

[L CC]
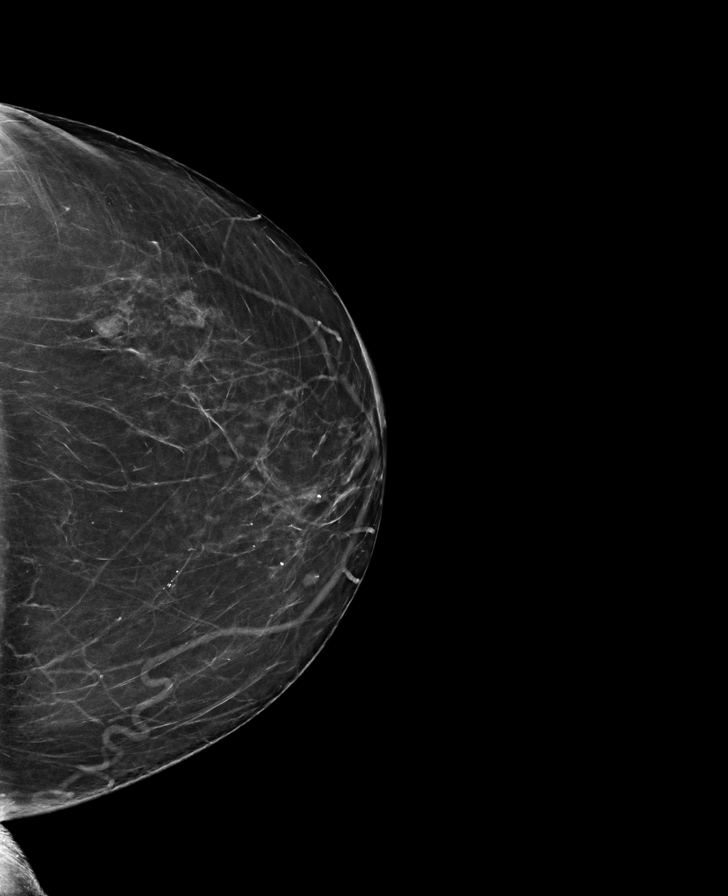

[R MLO]
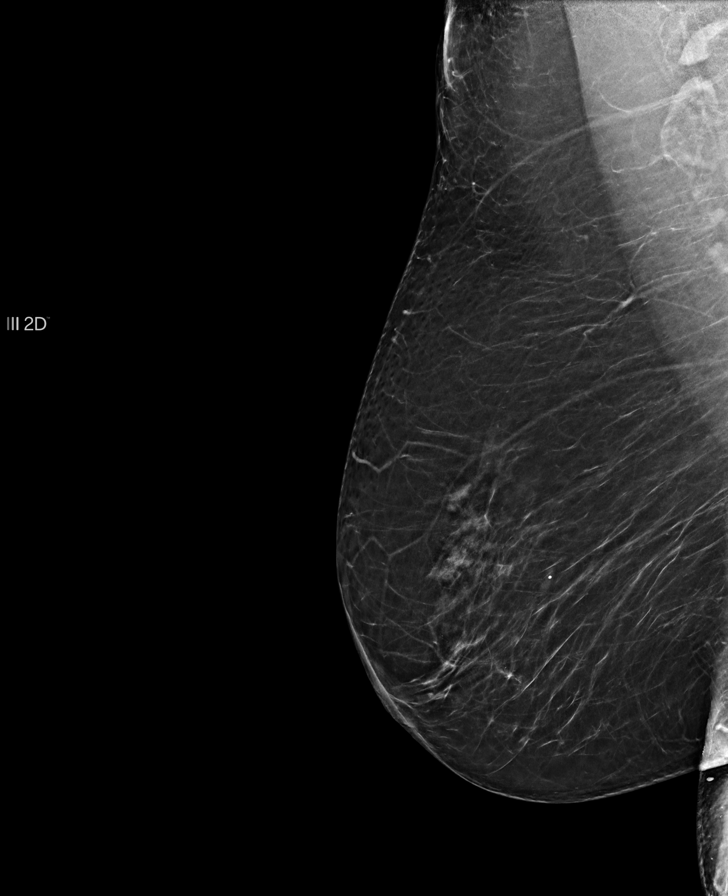

[R CC]
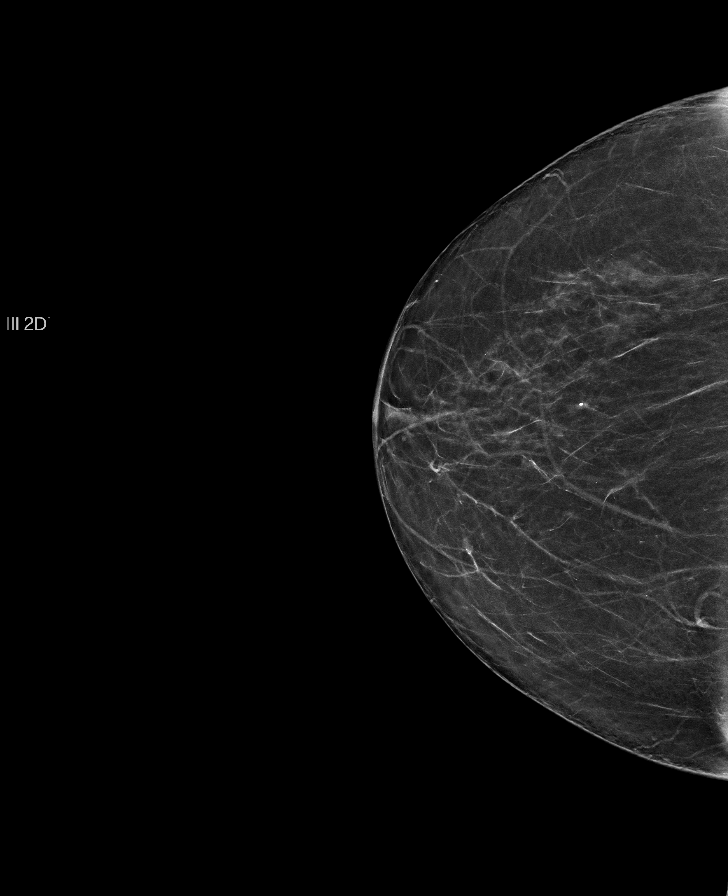

[R CC tomo · tomo slice 33/64.0]
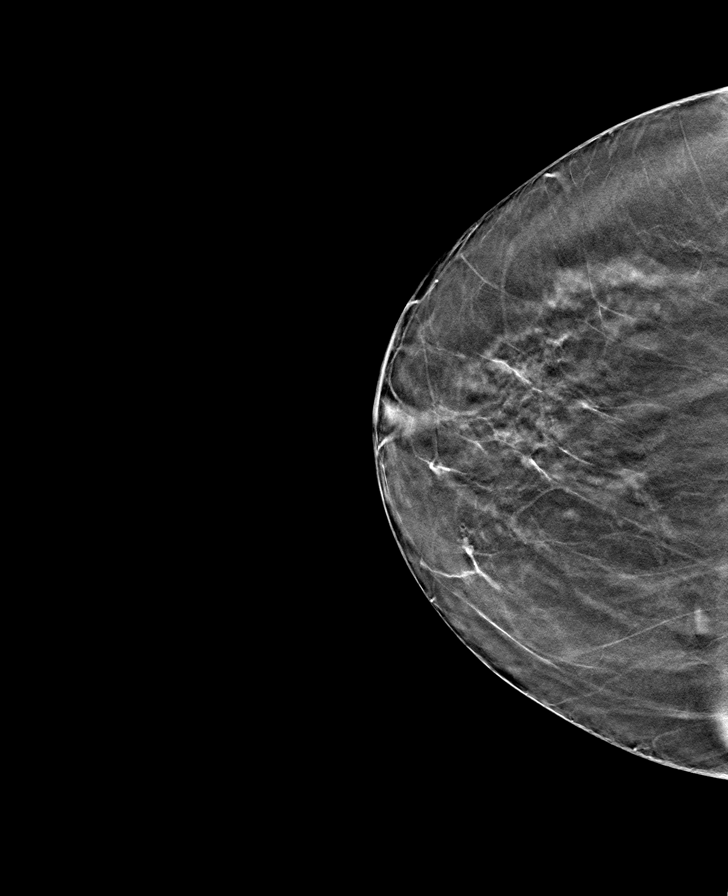

[R MLO tomo · tomo slice 40/79.0]
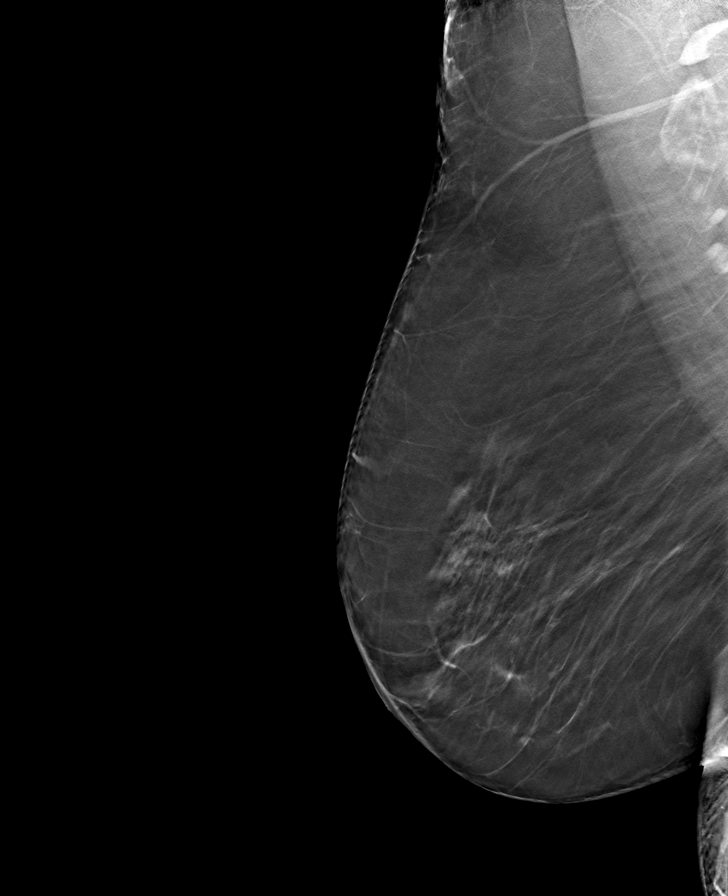

[L CC tomo · tomo slice 35/68.0]
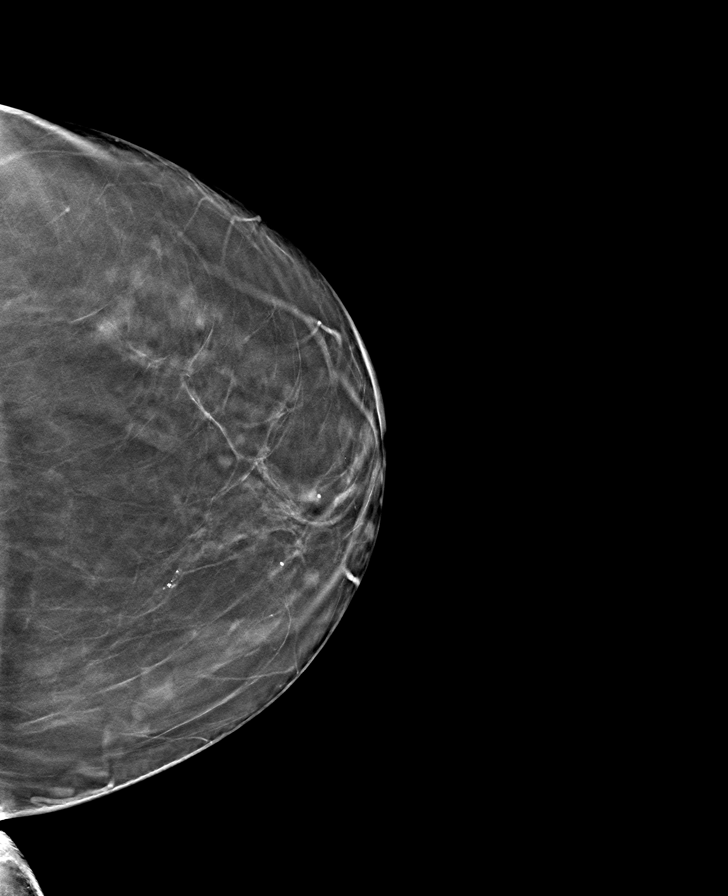

[L MLO tomo · tomo slice 39/77.0]
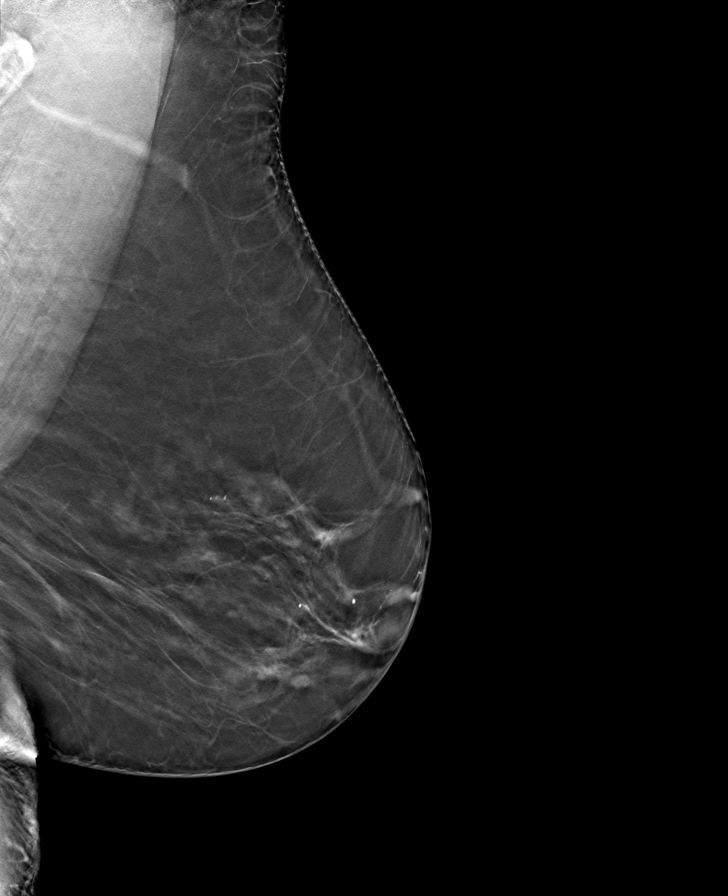

[8 of 24 positions shown; findings below may reference images not displayed]

FINDINGS: Right breast is unremarkable and unchanged. 
In the LEFT breast there are several small circumscribed scattered nodules. The 
largest of these, located in the upper-outer quadrant has exhibited interval 
enlargement previously measuring 7 mm and currently measuring up to 9 mm. This 
particular lesion will need to undergo sonographic evaluation. The other smaller 
nodules appear relatively stable. The breasts are otherwise unremarkable and 
unchanged..
IMPRESSION: (BI-RADS 0) Incomplete. Further evaluation will be performed as discussed above 
and the results will be reported separately.

## 2022-03-17 IMAGING — CT CT CHEST WITHOUT CONTRAST
2 of 3 series · 15 of 35 positions shown, 18 images · non-contrast
Comparison: 11/24/2021 CT scan and older exams.

________________________________________________________________________________________________ 
CT CHEST WITHOUT CONTRAST, 03/17/2022 [DATE]: 
CLINICAL INDICATION: Follow-up abnormal CT scan. 
A search for DICOM formatted images was conducted for prior CT imaging studies 
completed at a non-affiliated media free facility.
TECHNIQUE: The chest was scanned from base of neck through the lung bases 
without contrast on a high resolution low dose CT scanner. Routine MPR and MIP 
3D renderings were performed with concurrent physician supervision.

[Series 2: axial · axial · 0.77mm/px · z∈[-245,-11]mm · 12 of 131 slices shown, 15 images]
[im 7/131  mediastinal]
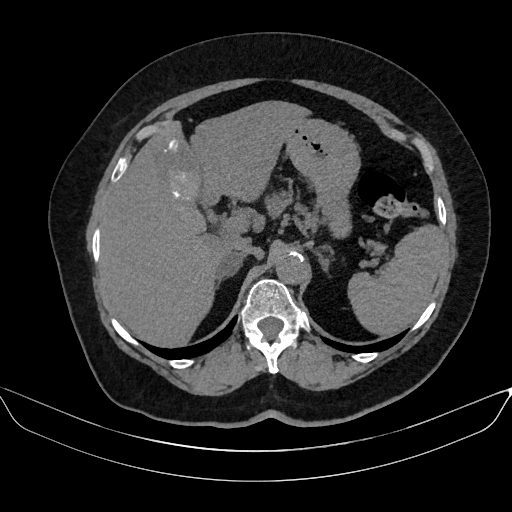
[im 7/131  lung]
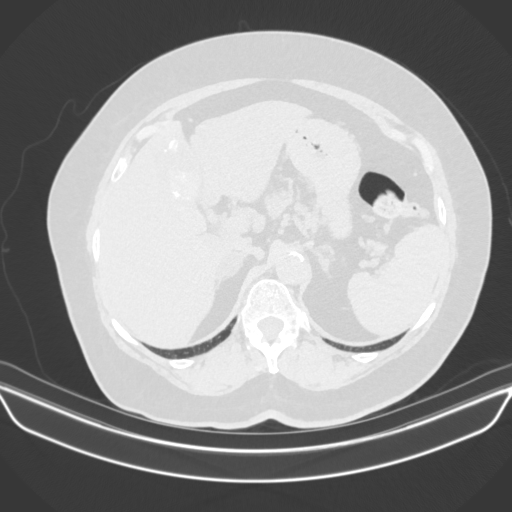
[im 19/131  lung]
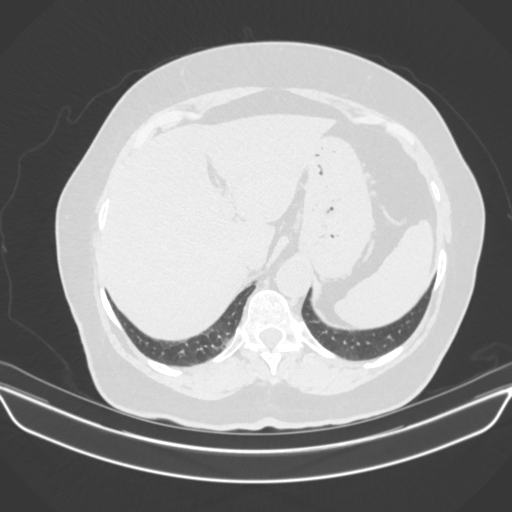
[im 31/131  lung]
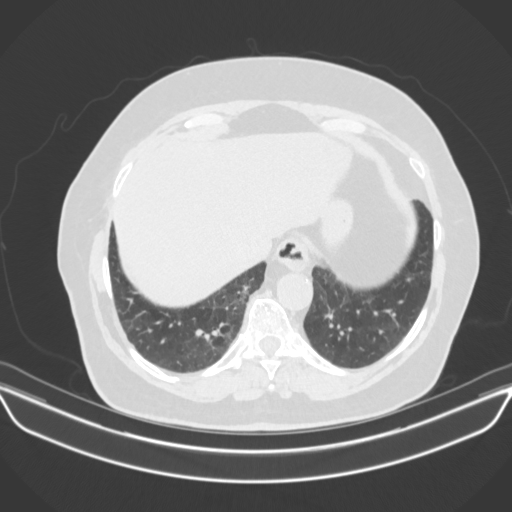
[im 38/131  lung]
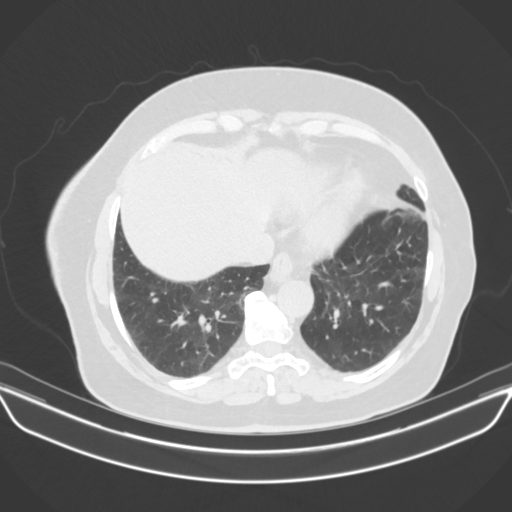
[im 50/131  mediastinal]
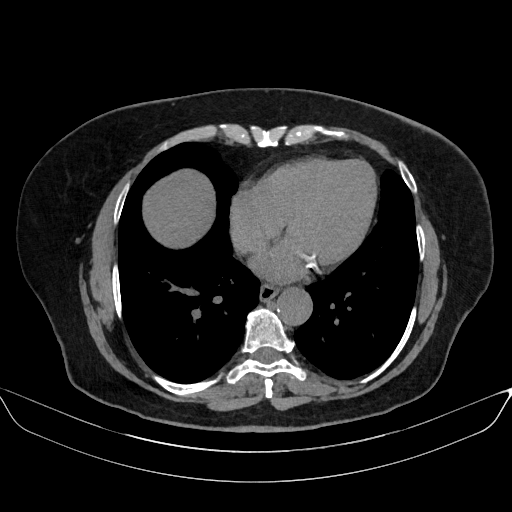
[im 50/131  lung]
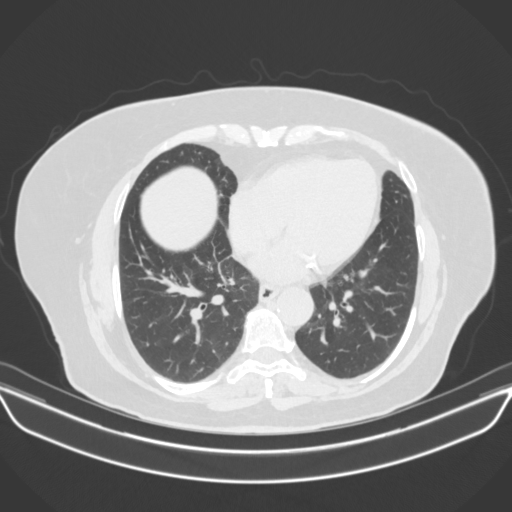
[im 62/131  lung]
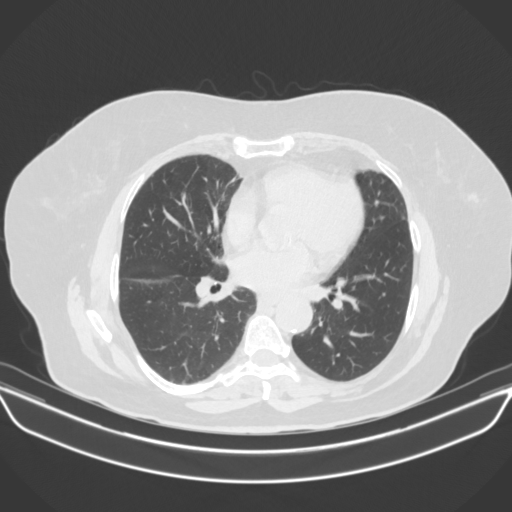
[im 69/131  lung]
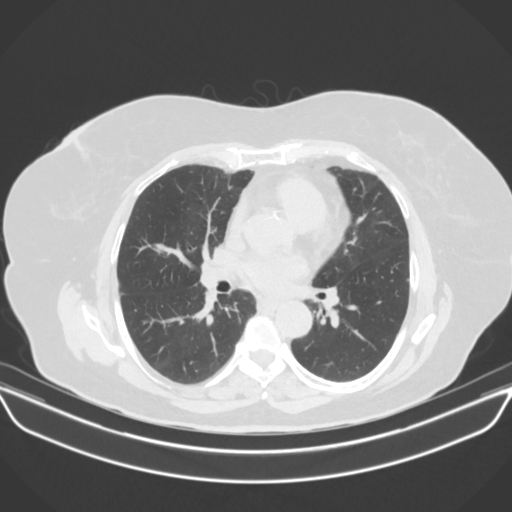
[im 81/131  lung]
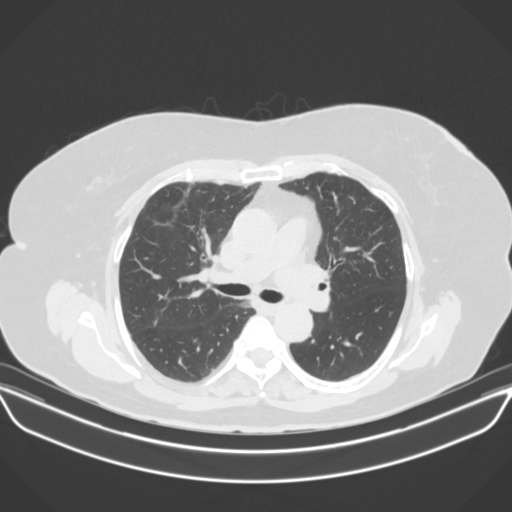
[im 93/131  mediastinal]
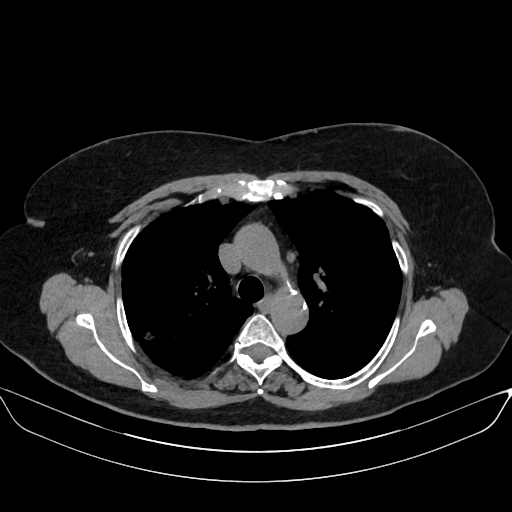
[im 93/131  lung]
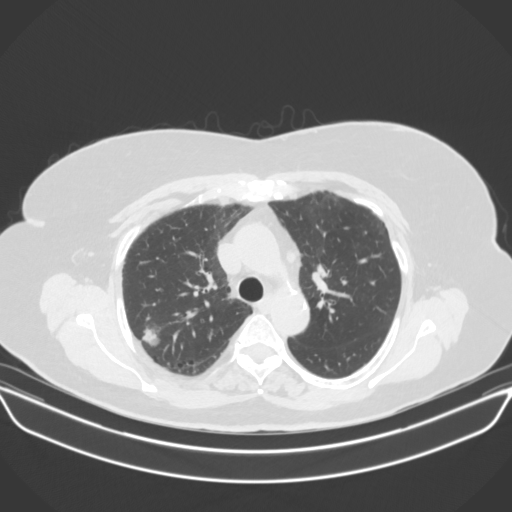
[im 100/131  lung]
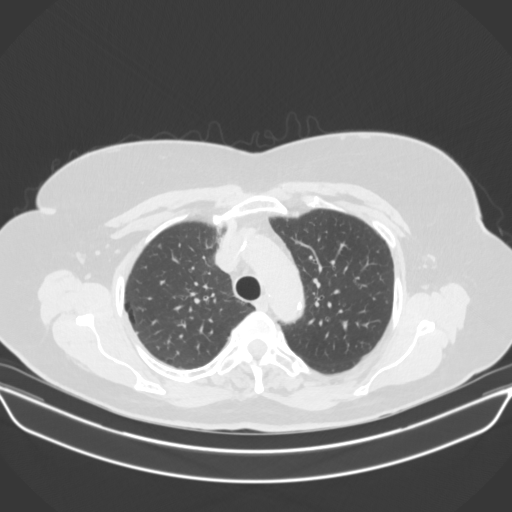
[im 112/131  lung]
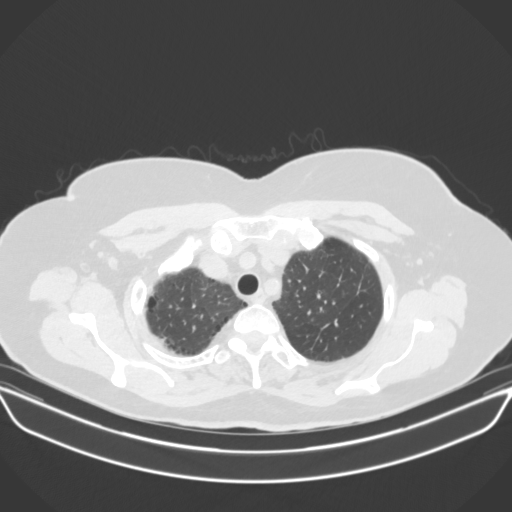
[im 124/131  lung]
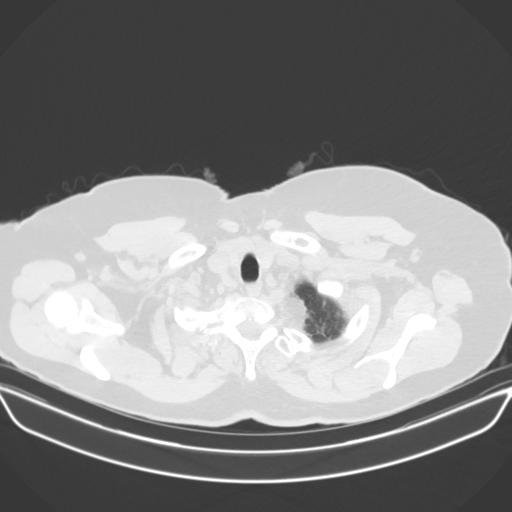

[Series 4: cor · coronal · 0.53mm/px · 3 of 136 slices shown]
[im 28/136  lung]
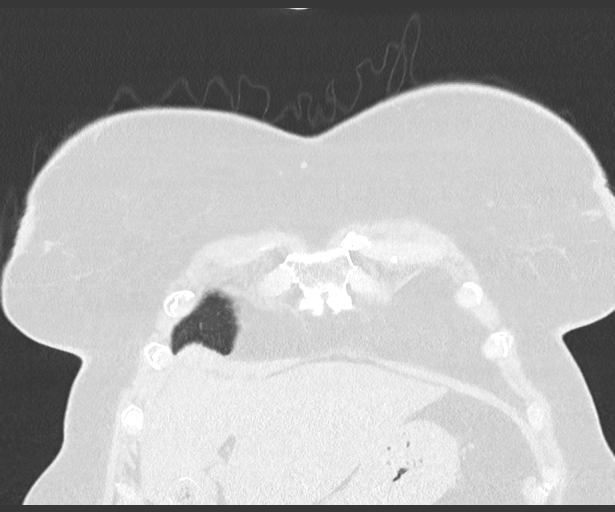
[im 55/136  lung]
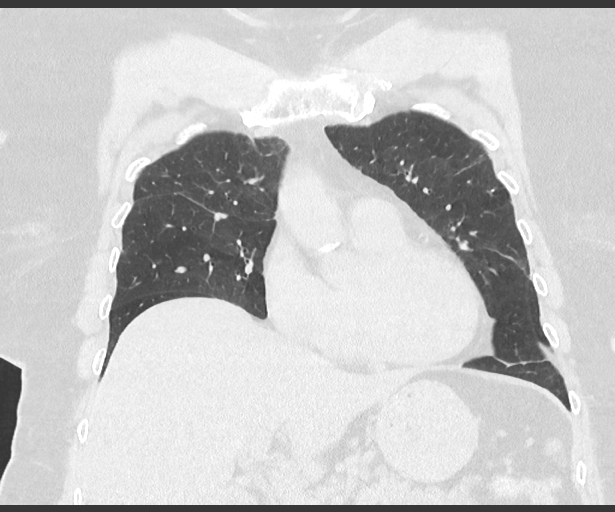
[im 82/136  lung]
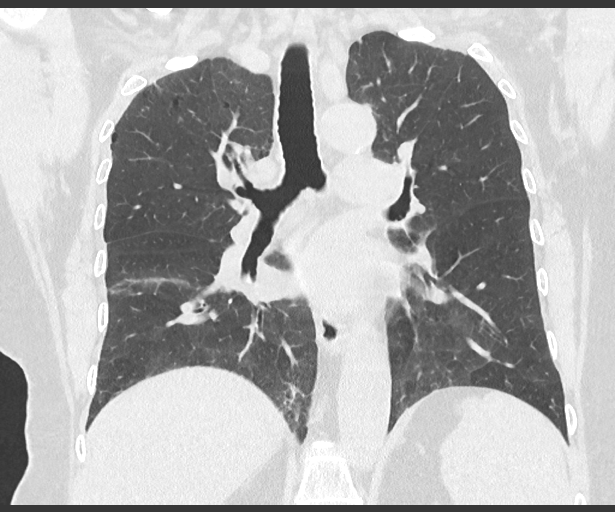

[15 of 35 positions shown; findings below may reference images not displayed]

FINDINGS: LUNGS AND PLEURA:  Emphysematous changes. The groundglass lesion within the 
right upper lobe is again identified. This has been seen since 7171 and is 
definitely shown growth since the 7171 and 5559 exam. On axial images this is 
more difficult to define. However, on coronal images the solid component along 
its superior posterior border has increased even since July of this year and 
definitely since September 2020 concerning for lepidic growth pattern and 
adenocarcinoma of the lung. PET scan may help better define though this may 
simply require biopsy as well. No new lesions seen elsewhere though there are 
some stable scattered micronodules elsewhere better seen on the MIPS slab 
reconstructed images. Right basilar atelectasis. 
MEDIASTINUM:  No adenopathy. Normal heart size. No pericardial effusion. 
Moderate coronary calcifications. 
CHEST WALL/AXILLA: No mass or adenopathy.  
UPPER ABDOMEN: Fatty infiltration liver. Cholelithiasis. 
MUSCULOSKELETAL: Degenerative changes.
IMPRESSION: Concerning mixed density lesion right upper lobe which has shown increasing 
solid component along its superior posterior margin best noted on coronal 
images. The slow progression over time is concerning for low-grade malignancy. 
Would recommend PET scan to better evaluate and biopsy. 
Emphysematous changes, atherosclerotic changes and degenerative changes. 
Fatty infiltration liver and cholelithiasis. 
RADIATION DOSE REDUCTION: All CT scans are performed using radiation dose 
reduction techniques, when applicable.  Technical factors are evaluated and 
adjusted to ensure appropriate moderation of exposure.  Automated dose 
management technology is applied to adjust the radiation doses to minimize 
exposure while achieving diagnostic quality images.

## 2022-03-31 IMAGING — PT PET CT SCAN TUMOR IMAGING SKULL TO THIGH
1 of 4 series · 1 of 25 positions shown · non-contrast
Comparison: Chest CT March 17, 2022

________________________________________________________________________________________________ 
PET CT SCAN TUMOR IMAGING SKULL TO THIGH, 03/31/2022 [DATE]: 
CLINICAL INDICATION:  Evaluate right upper lobe lung lesion, solitary pulmonary 
nodule
TECHNIQUE: A dose of 13.0 millicuries of 18-FDG was administered intravenously 
and skull to thigh PET scanning was performed at 55 minutes. Tomographic scans 
were reconstructed in axial, coronal, and sagittal projections. The data was 
reconstructed into a three-dimensional volume rendered images and reviewed in a 
rotational cine loop. Serum blood glucose at the time of injection was 109 
mg/dl.

[Series 6990: (preview) body · axial · 4.0mm · 4.00mm/px · 1 of 234 slices shown]
[im 140/234]
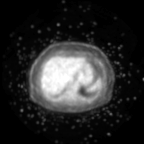

[1 of 25 positions shown; findings below may reference images not displayed]

FINDINGS: The semisolid nodule in the right upper lobe is best seen on todays 
CT image 64. On the prior chest CT image 43 the lesion showed lobulated margins 
with approximate measurement 2.6 x 2.0 cm. There is a smaller central solid 
component. There is FDG activity in the solid component, with maximum SUV 2.1. 
The solid component measures approximately 6 x 5 mm. Given the relatively small 
size and FDG activity this is concerning for adenocarcinoma or other 
bronchogenic neoplasm. Biopsy recommended. 
There is no other pulmonary FDG activity. No mediastinal or hilar uptake. 
Cervical, supraclavicular and axillary node bearing regions show no abnormal 
activity. 
There is no concerning infradiaphragmatic uptake. Mild bowel activity is likely 
physiologic, without CT correlate. There is physiologic urinary excretion of 
radiopharmaceutical. 
There are degenerative changes. There is no concerning osseous activity. 
CT images show extensive cholelithiasis. There is fatty infiltration of the 
liver. Small hiatal hernia. There are is diverticulosis of the sigmoid colon. 
There is mild to moderate arteriosclerotic plaque in the abdominal aorta and 
common iliac arteries.
IMPRESSION: FDG activity in the semisolid right upper lobe nodule is concerning for 
adenocarcinoma or other bronchogenic neoplasm. Biopsy targeting the solid 
component recommended. 
No evidence for regional hematogenous metastasis or other concerning FDG 
activity. 
Fatty infiltration of the liver. 
Cholelithiasis.  
Moderate sigmoid diverticulosis. 
Small hiatal hernia. 
Mild to moderate aortoiliac plaque. Mild coronary artery calcifications. Mitral 
valve calcification. 
Degenerative changes.

## 2022-07-30 IMAGING — CT CT CHEST WITHOUT CONTRAST
2 of 4 series · 15 of 36 positions shown, 18 images · non-contrast
Comparison: CT 03/17/2022 and PET scan 03/31/2022

________________________________________________________________________________________________ 
CT CHEST WITHOUT CONTRAST, 07/30/2022 [DATE]: 
CLINICAL INDICATION: Cough. 
A search for DICOM formatted images was conducted for prior CT imaging studies 
completed at a non-affiliated media free facility.
TECHNIQUE: The chest was scanned from base of neck through the lung bases 
without contrast on a high resolution low dose CT scanner. Routine MPR and MIP 
reconstruction images were performed. Count of known CT and Cardiac Nuclear 
Medicine studies performed in the previous 12 months = < 2.>

[Series 2: chest 2.0 i31s 3 · axial · 0.74mm/px · z∈[-252,+12]mm · 12 of 146 slices shown, 15 images]
[im 7/146  mediastinal]
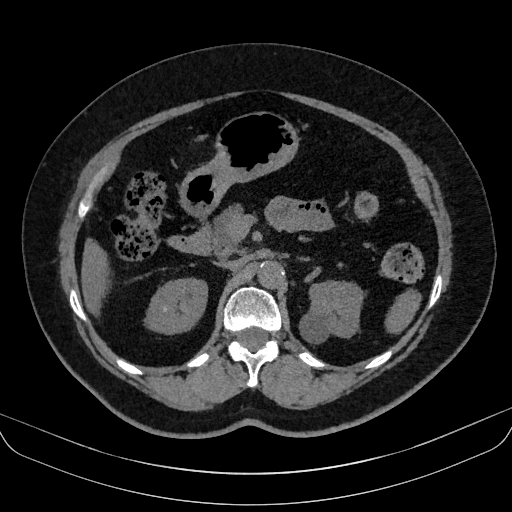
[im 7/146  lung]
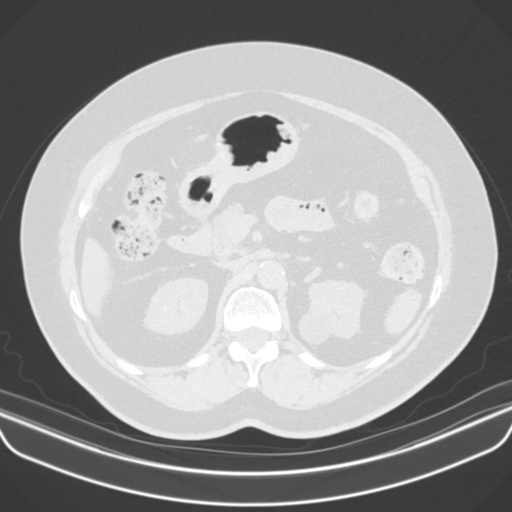
[im 21/146  lung]
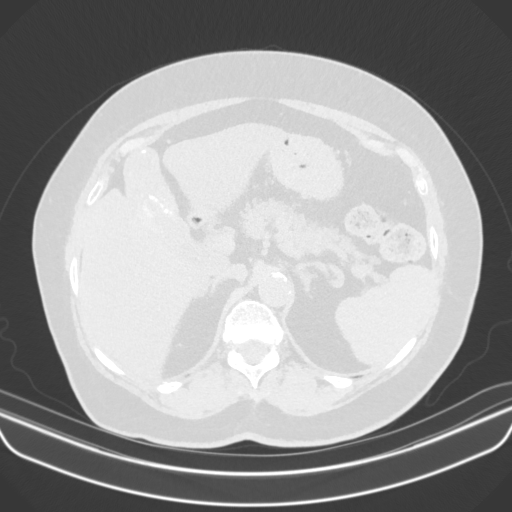
[im 35/146  lung]
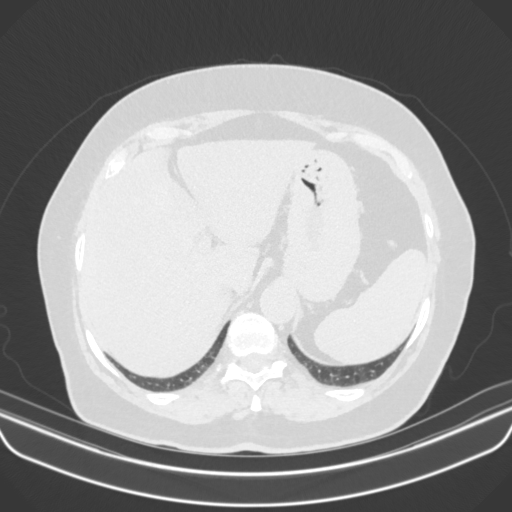
[im 42/146  lung]
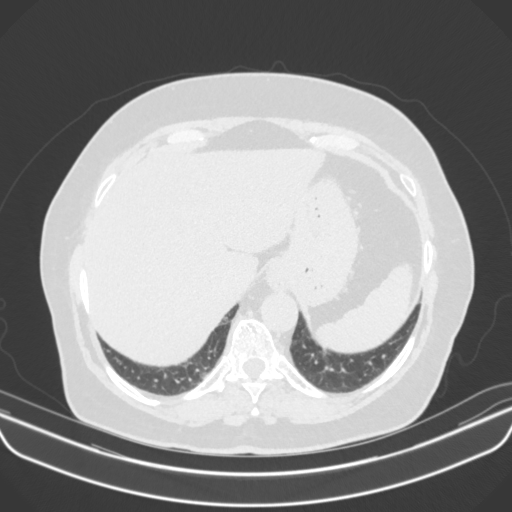
[im 56/146  mediastinal]
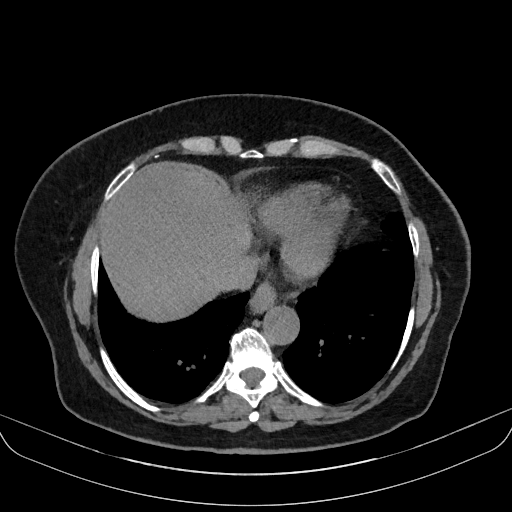
[im 56/146  lung]
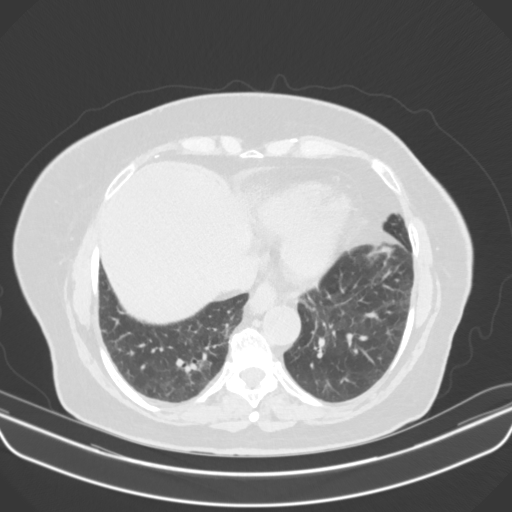
[im 70/146  lung]
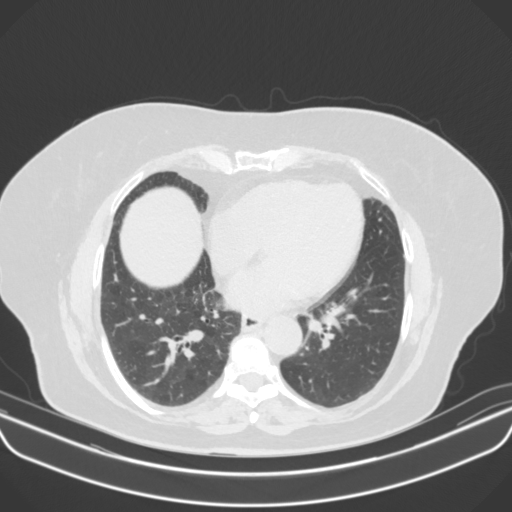
[im 76/146  lung]
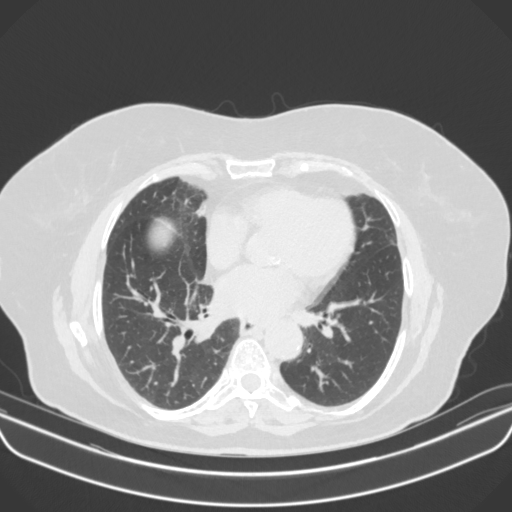
[im 90/146  lung]
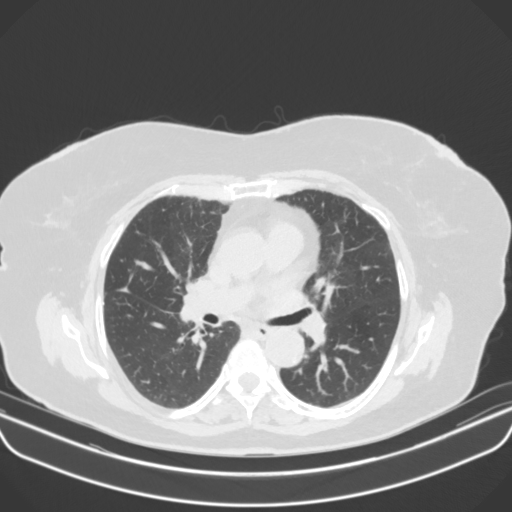
[im 104/146  mediastinal]
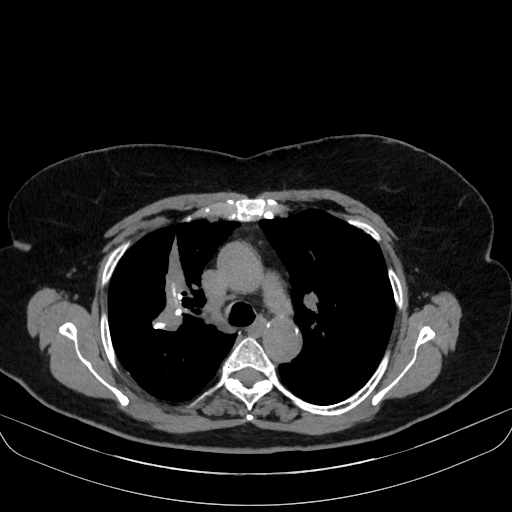
[im 104/146  lung]
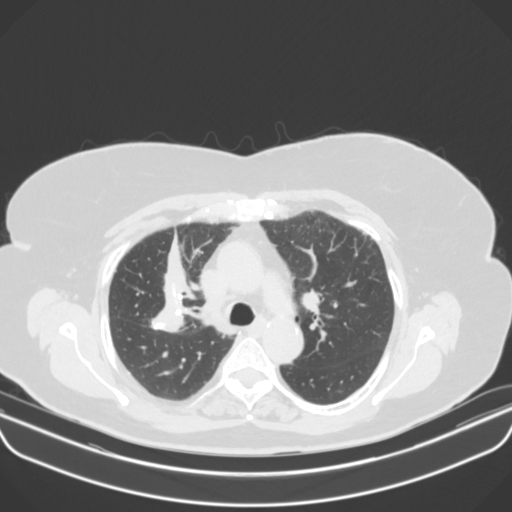
[im 111/146  lung]
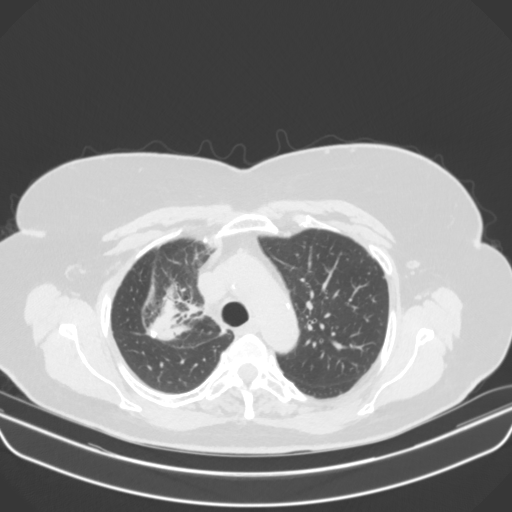
[im 125/146  lung]
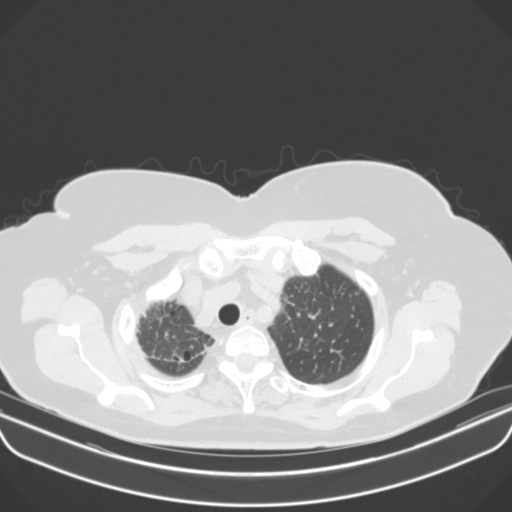
[im 139/146  lung]
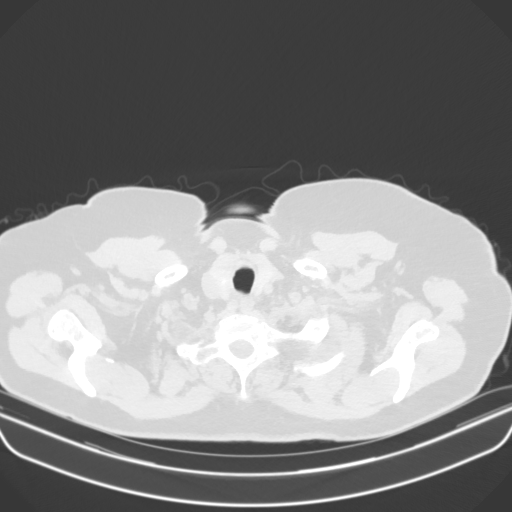

[Series 4: coronal · coronal · 0.58mm/px · 3 of 148 slices shown]
[im 30/148  lung]
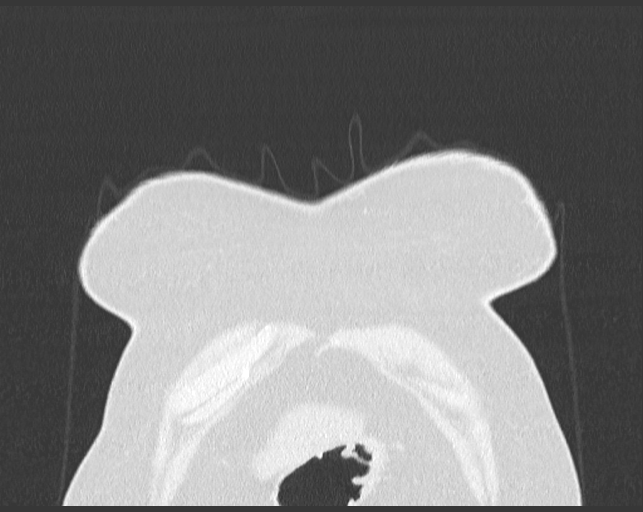
[im 59/148  lung]
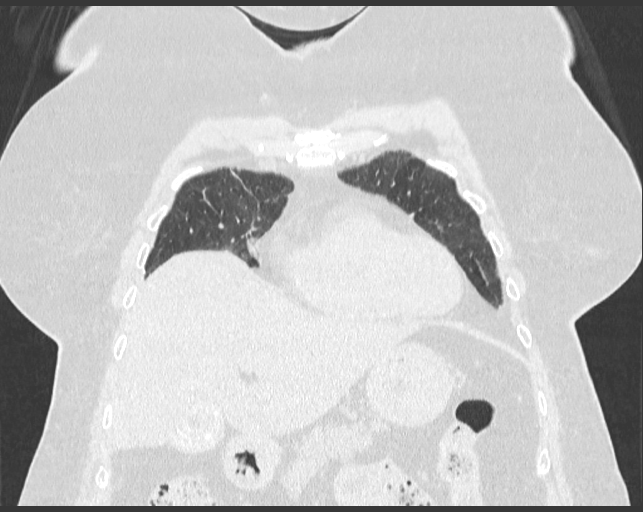
[im 89/148  lung]
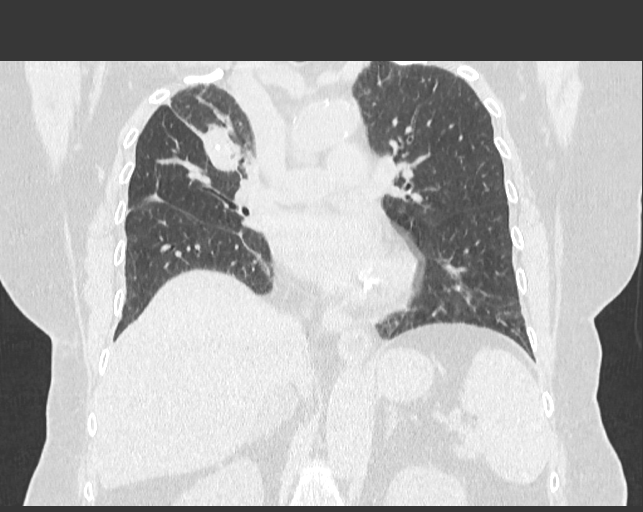

[15 of 36 positions shown; findings below may reference images not displayed]

FINDINGS: LUNGS AND PLEURA:  Post therapy change right upper lobe. Peribronchial 
thickening with scattered atelectasis but no large area consolidation or 
effusion. No new suspicious pulmonary nodule  
MEDIASTINUM:  No adenopathy. Normal heart size. No pericardial effusion. 
Moderate coronary artery calcifications noted. 
CHEST WALL/AXILLA: No mass or adenopathy.  
UPPER ABDOMEN: Small hiatal hernia. Cholelithiasis. Renal cysts. Stable 1.9 cm 
low-attenuation nodule right adrenal gland. 
MUSCULOSKELETAL: No acute abnormality. Degenerative change.
IMPRESSION: Peribronchial thickening with scattered atelectasis but no acute consolidation 
or effusion. 
Post therapy change right upper lobe. 
No new suspicious pulmonary nodule. No mediastinal or hilar adenopathy. 
RADIATION DOSE REDUCTION: All CT scans are performed using radiation dose 
reduction techniques, when applicable.  Technical factors are evaluated and 
adjusted to ensure appropriate moderation of exposure.  Automated dose 
management technology is applied to adjust the radiation doses to minimize 
exposure while achieving diagnostic quality images.

## 2022-10-13 IMAGING — MR MRI ABDOMEN W/WO CONTRAST
16 of 24 series · 26 of 48 positions shown · IV contrast (gadavist)
Comparison: Multiple prior CT scans. Prior CT the abdomen from November 2021.

________________________________________________________________________________________________ 
MRI ABDOMEN W/WO CONTRAST, 10/13/2022 [DATE]: 
CLINICAL INDICATION: Follow-up history of pancreatic cyst.
TECHNIQUE: Multiplanar, multiecho position MR images of the were performed 
without and with intravenous enhancement. 7.5 mL of Gadavist were injected 
intravenously by hand. Patient was scanned on a 3T magnet.

[Series 101: survey-head 1st · axial · 15.0mm · 1.76mm/px · 1 of 11 slices shown]
[im 1/11]
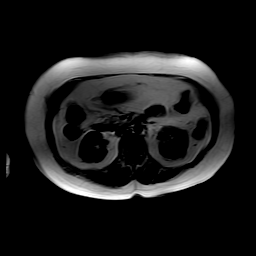

[Series 301: T2 · coronal · 5.0mm · 0.66mm/px · 1 of 34 slices shown]
[im 1/34]
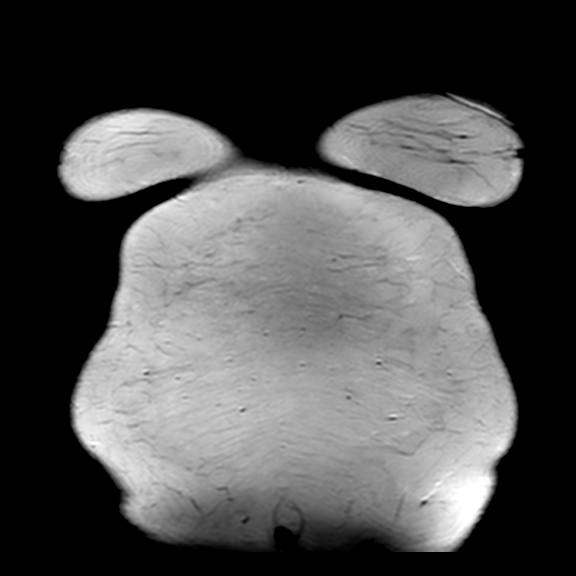

[Series 402: sout of phase · axial · 5.5mm · 1.19mm/px · 1 of 32 slices shown]
[im 1/32]
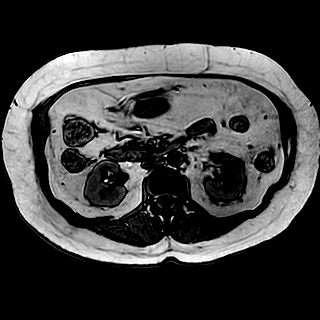

[Series 403: sin phase · axial · 5.5mm · 1.19mm/px · 1 of 32 slices shown]
[im 1/32]
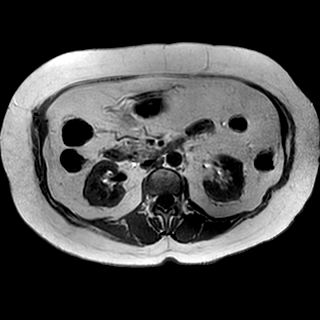

[Series 501: t2_ax_mvxd_hr_rt · axial · 5.0mm · 0.47mm/px · 1 of 39 slices shown]
[im 1/39]
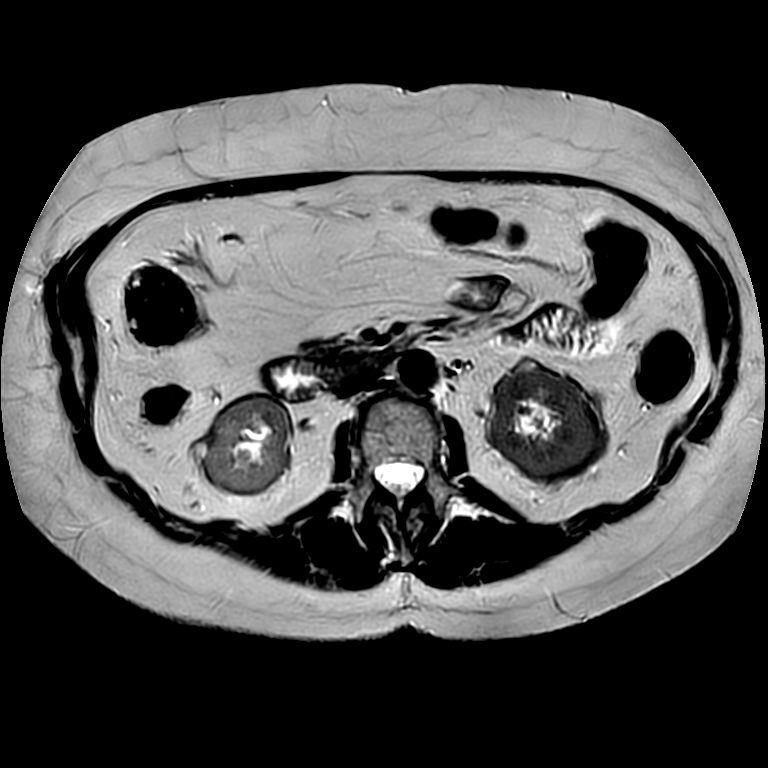

[Series 601: t2_spir_ax_mvxd_hr_rt · axial · 5.0mm · 0.83mm/px · 1 of 39 slices shown]
[im 1/39]
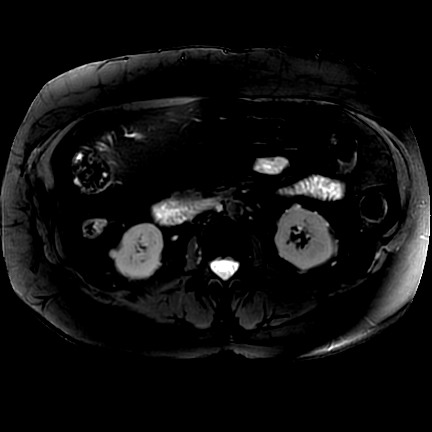

[Series 801: ssh_mrcprad · oblique · 40.0mm · 0.59mm/px · 1 of 6 slices shown]
[im 1/6]
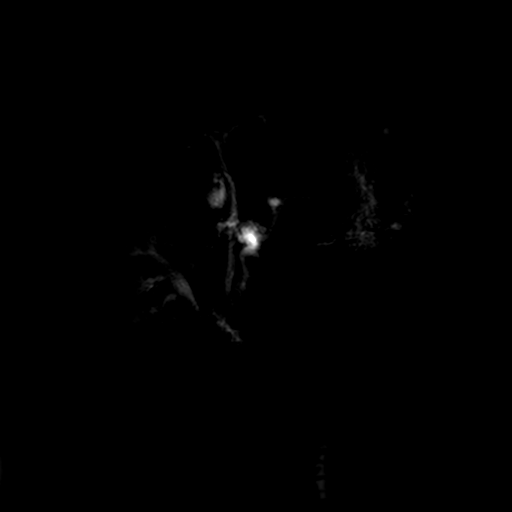

[Series 902: dadc 600 · axial · 5.0mm · 1.37mm/px · 1 of 45 slices shown]
[im 1/45]
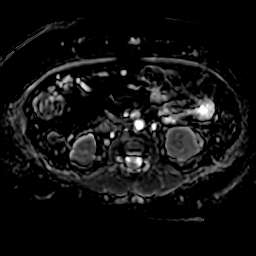

[Series 903: sb0 · axial · 5.0mm · 1.37mm/px · 1 of 45 slices shown]
[im 1/45]
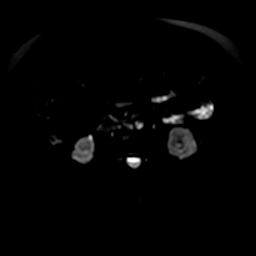

[Series 904: (id) · axial · 5.0mm · 1.37mm/px · 1 of 45 slices shown]
[im 1/45]
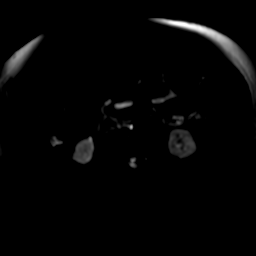

[Series 1002: DIXON · axial · 3.5mm · 0.85mm/px · z∈[-42,+177]mm · 3 of 126 slices shown (1 of 5)]
[im 1/126]
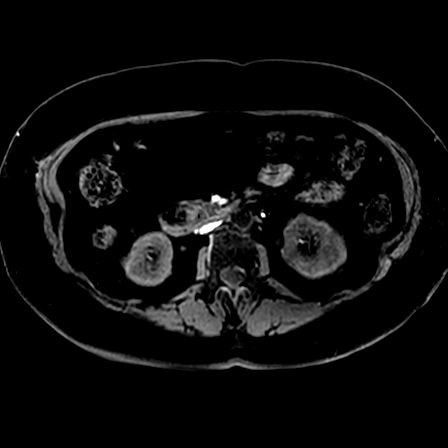
[im 63/126]
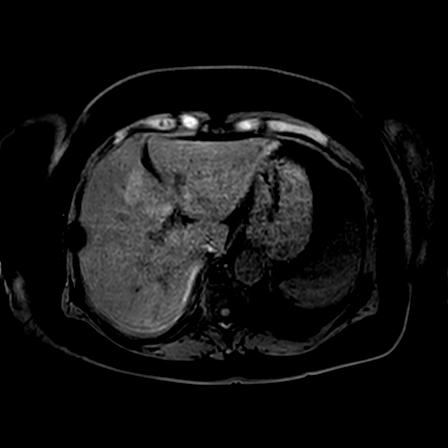
[im 126/126]
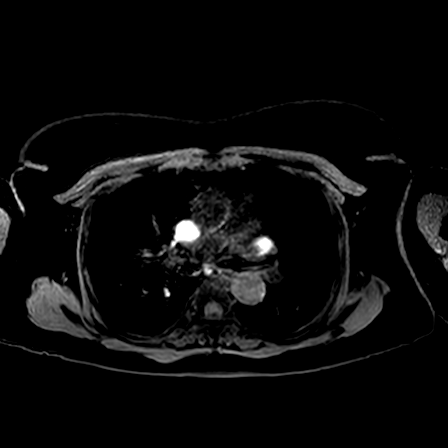

[Series 1003: DIXON · axial · 3.5mm · 0.85mm/px · z∈[-42,+177]mm · 3 of 126 slices shown (2 of 5)]
[im 1/126]
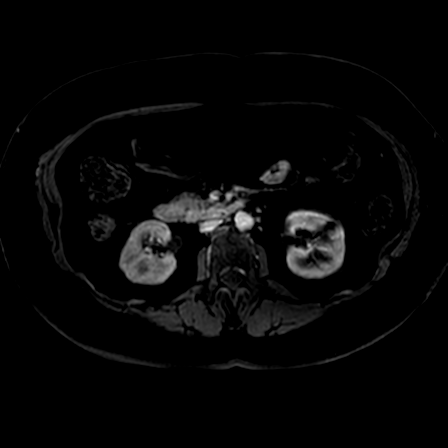
[im 63/126]
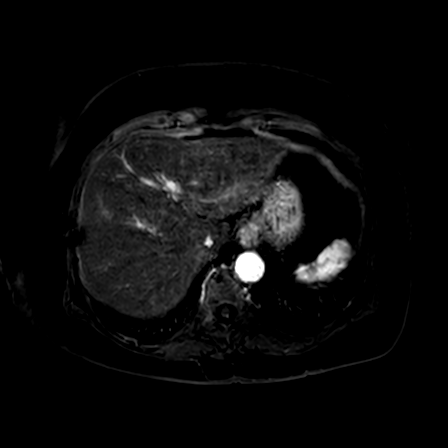
[im 126/126]
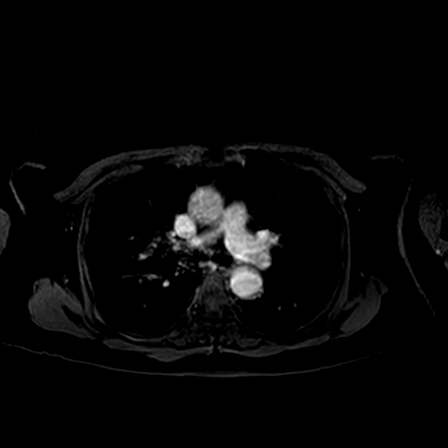

[Series 1004: DIXON · axial · 3.5mm · 0.85mm/px · z∈[-42,+177]mm · 3 of 126 slices shown (3 of 5)]
[im 1/126]
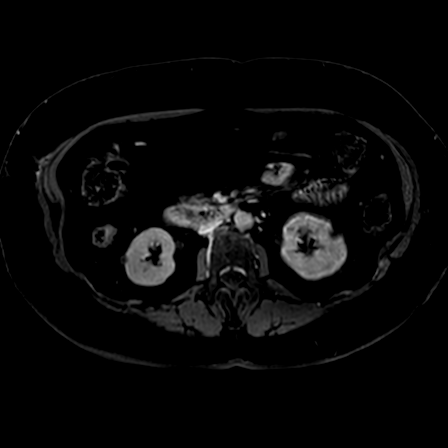
[im 63/126]
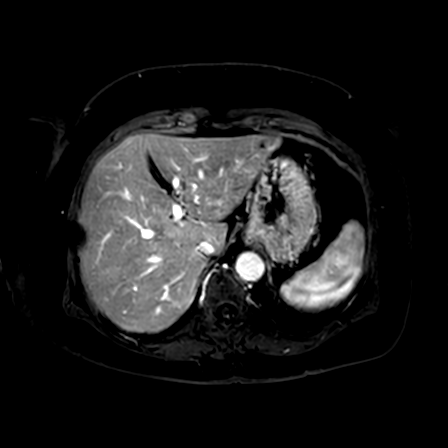
[im 126/126]
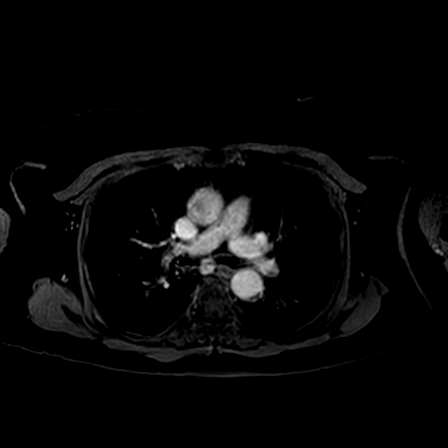

[Series 1005: DIXON · axial · 3.5mm · 0.85mm/px · z∈[-42,+177]mm · 3 of 126 slices shown (4 of 5)]
[im 1/126]
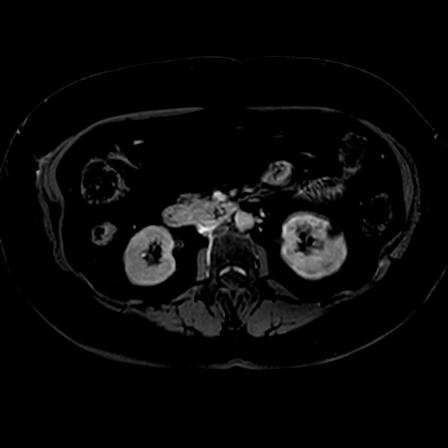
[im 63/126]
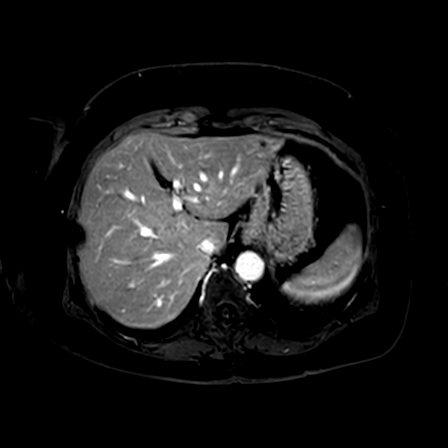
[im 126/126]
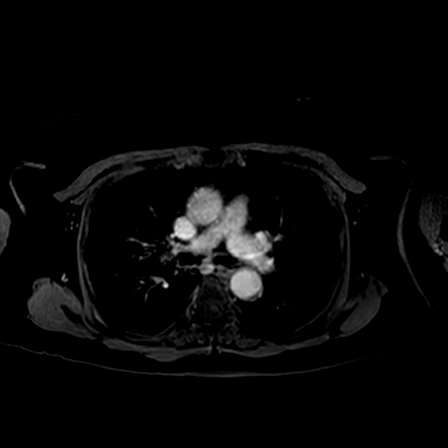

[Series 1006: DIXON · axial · 3.5mm · 0.85mm/px · z∈[-42,+177]mm · 3 of 126 slices shown (5 of 5)]
[im 1/126]
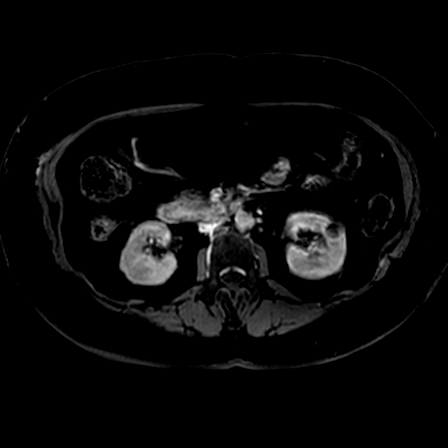
[im 63/126]
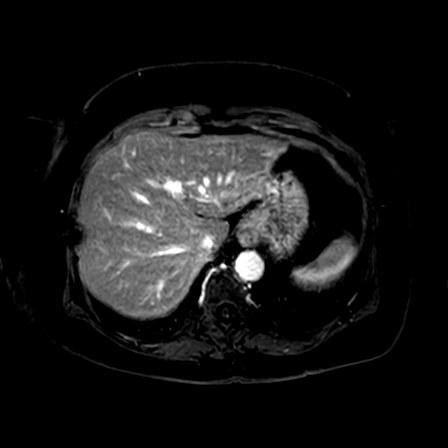
[im 126/126]
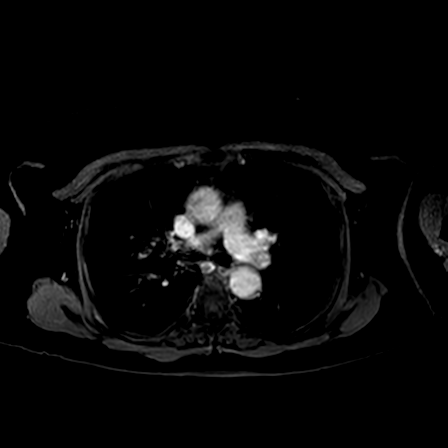

[Series 1007: DIXON post-contrast · axial · 3.5mm · 0.85mm/px · 1 of 126 slices shown]
[im 1/126]
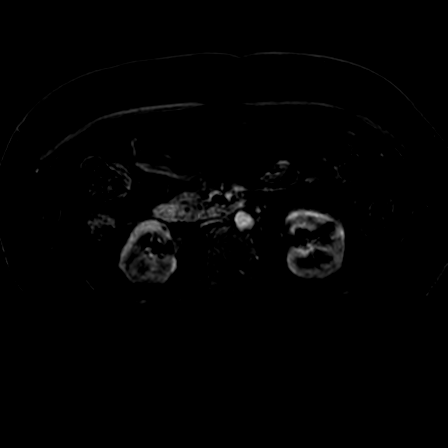

[26 of 48 positions shown; findings below may reference images not displayed]

FINDINGS: There is a group of cysts identified within the head of the pancreas. 
Im uncertain if this is multiple small adjacent cyst or this is a single cystic 
lesion with a lobulated surface possibly representing a serous cystic neoplasm 
in this 76-year-old patient. Overall dimension on T2-weighted images is 2.6 x 
1.2 cm. There are 2 more simple appearing cyst seen within the distal body and 
tail. Posteriorly on image 13 measures 1.2 cm and on image 14, 1.4 cm. None of 
these enhance. There is no pancreatic atrophy. No pancreatic ductal dilatation. 
Artifact from metallic foreign bodies overlying the right breast are seen. 
Cholelithiasis. Liver is unremarkable in appearance. Common bile ducts within 
normal limits. 
Spleen, left adrenal gland and kidneys are unremarkable in appearance with 
simple cyst seen. There is a lipid rich 2 cm right adrenal adenoma.
IMPRESSION: Cystic changes within the pancreas as discussed above. Given the fact that this 
is slightly atypical from standard simple appearing cyst would consider 
short-term follow-up with repeat MRI with and without contrast in 6 months. 
Lipid rich right adrenal adenoma. 
Cholelithiasis.

## 2022-11-18 IMAGING — CT CT CHEST WITHOUT CONTRAST
2 of 4 series · 15 of 36 positions shown, 18 images · non-contrast
Comparison: CT 07/30/2022.

________________________________________________________________________________________________ 
CT CHEST WITHOUT CONTRAST, 11/18/2022 [DATE]: 
CLINICAL INDICATION: Malignant neoplasm of upper lobe, right bronchus or lung 
A search for DICOM formatted images was conducted for prior CT imaging studies 
completed at a non-affiliated media free facility.
TECHNIQUE: The chest was scanned from base of neck through the lung bases 
without contrast on a high resolution low dose CT scanner. Routine MPR and MIP 
reconstruction images were performed.

[Series 2: chest 2.0 i31s 3 · axial · 0.63mm/px · z∈[-318,-52]mm · 12 of 147 slices shown, 15 images]
[im 7/147  mediastinal]
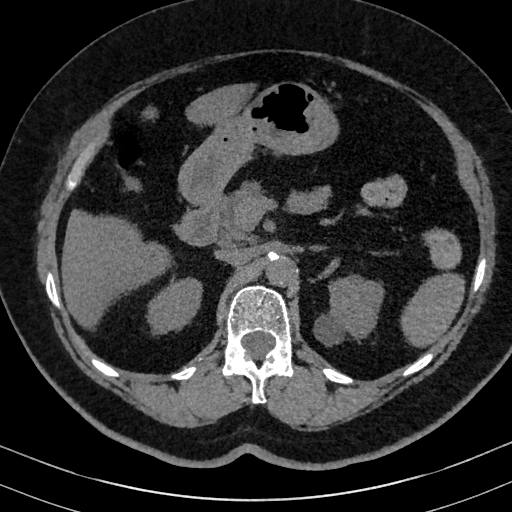
[im 7/147  lung]
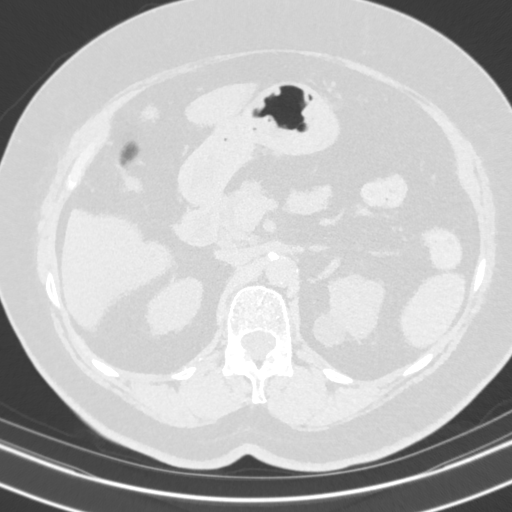
[im 21/147  lung]
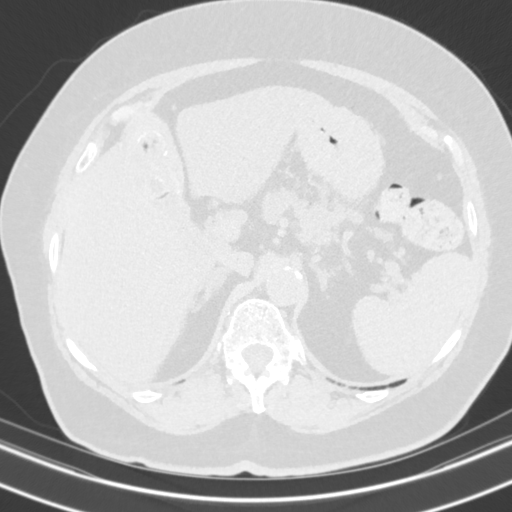
[im 35/147  lung]
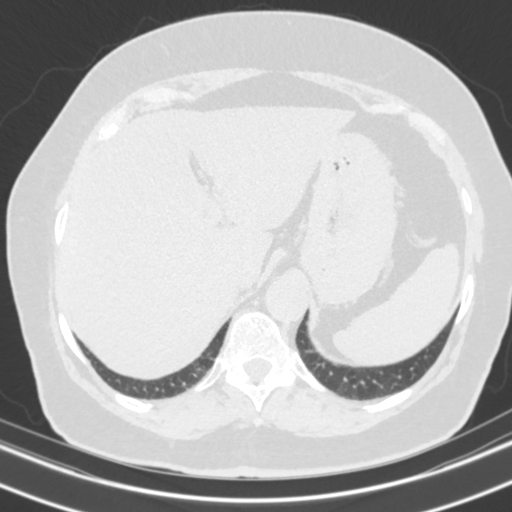
[im 42/147  lung]
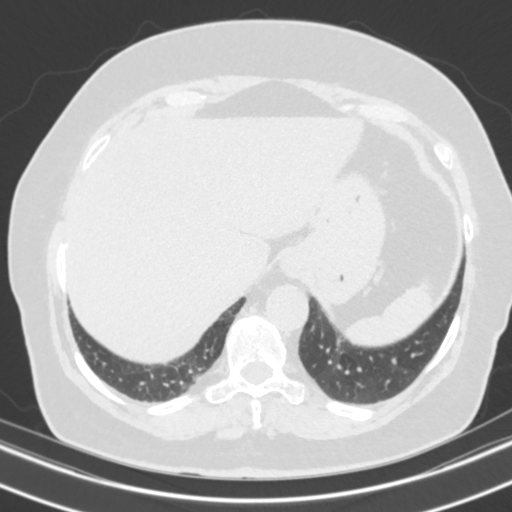
[im 56/147  mediastinal]
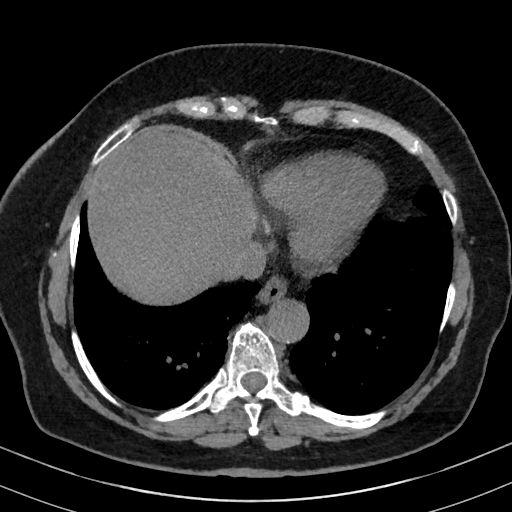
[im 56/147  lung]
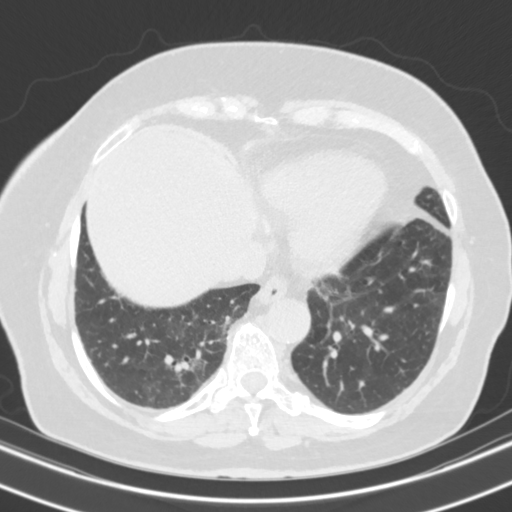
[im 70/147  lung]
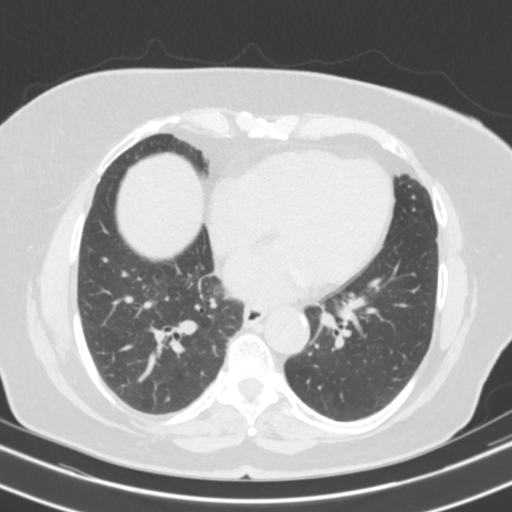
[im 77/147  lung]
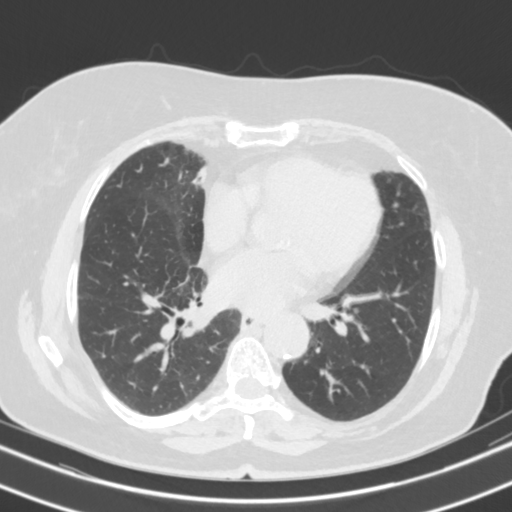
[im 91/147  lung]
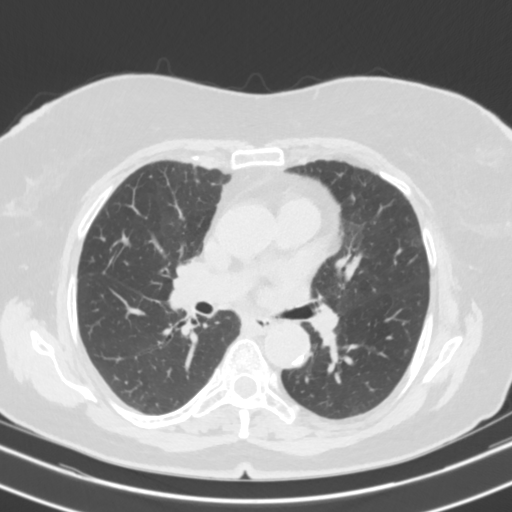
[im 105/147  mediastinal]
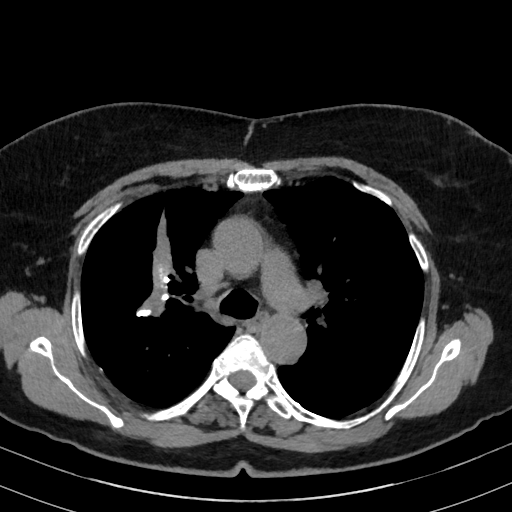
[im 105/147  lung]
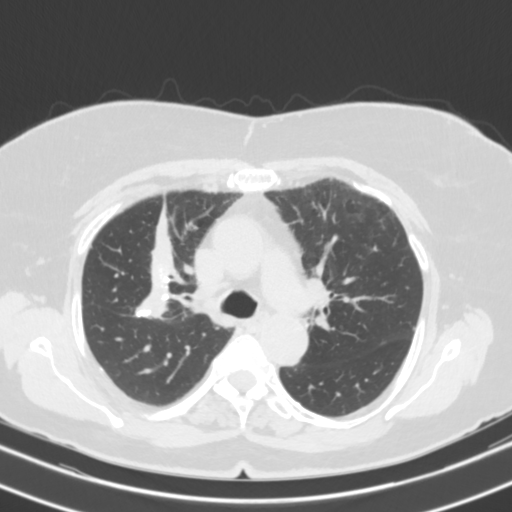
[im 112/147  lung]
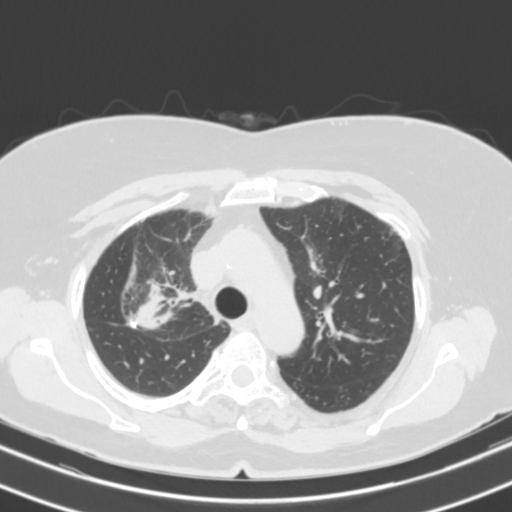
[im 126/147  lung]
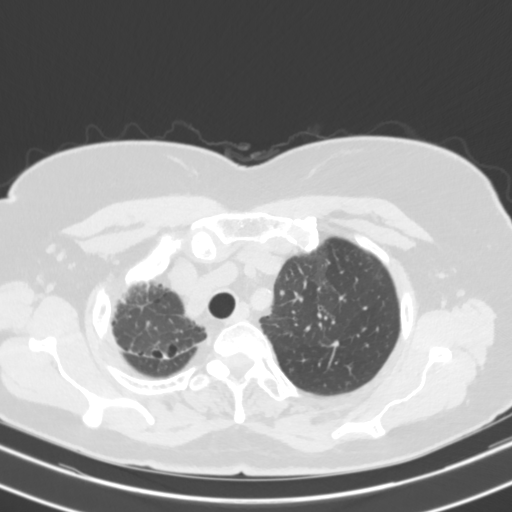
[im 140/147  lung]
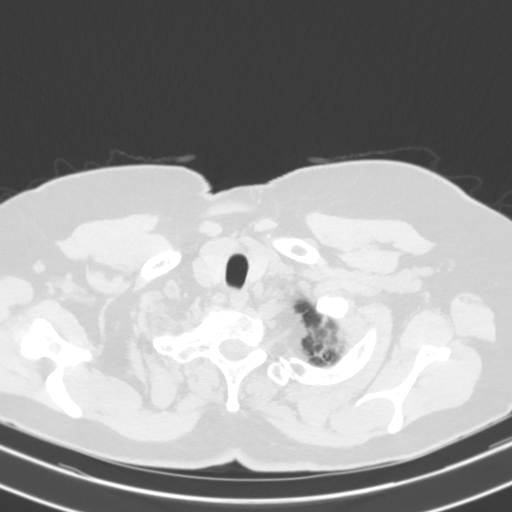

[Series 4: coronal · coronal · 0.59mm/px · 3 of 154 slices shown]
[im 31/154  lung]
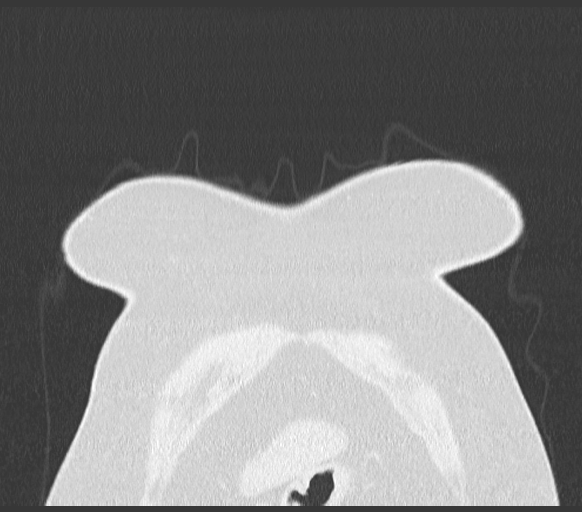
[im 62/154  lung]
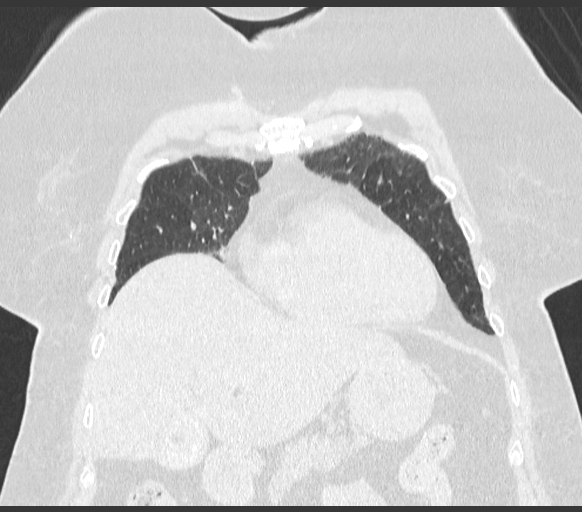
[im 92/154  lung]
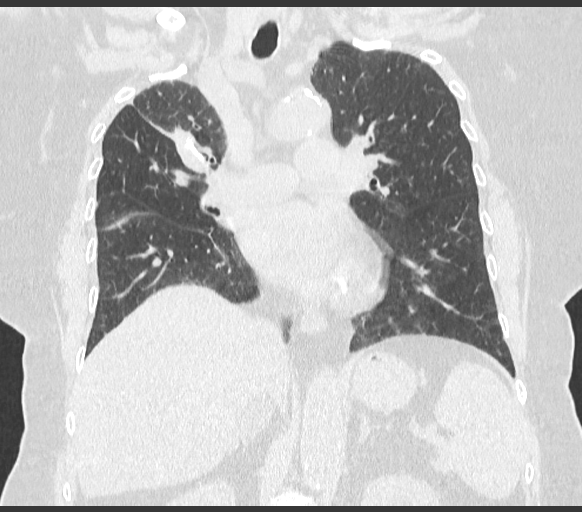

[15 of 36 positions shown; findings below may reference images not displayed]

Count of known CT and Cardiac Nuclear Medicine studies performed in the previous 
12 months = 3.
FINDINGS: LUNGS AND PLEURA:  Post therapy change right upper lobe. Peribronchial 
thickening with scattered atelectasis but no large area consolidation or 
effusion. No new suspicious pulmonary nodule  
MEDIASTINUM:  No adenopathy. Normal heart size. No pericardial effusion. 
Moderate coronary artery calcifications noted. 
CHEST WALL/AXILLA: No mass or adenopathy.  
UPPER ABDOMEN: Small hiatal hernia. Cholelithiasis. Renal cysts. Stable 1.9 cm 
low-attenuation nodule right adrenal gland. 
MUSCULOSKELETAL: No acute abnormality. Degenerative change.
IMPRESSION: Peribronchial thickening with scattered atelectasis but no acute consolidation 
or effusion. 
Post therapy change right upper lobe. 
No new suspicious pulmonary nodule.  
No mediastinal or hilar adenopathy. 
In patients between the ages of 50-77 where pulmonary emphysema is noted on CT, 
recommend evaluation for low dose lung cancer screening protocol if patient is 
not already enrolled; as pulmonary emphysema is an independent risk factor for 
lung cancer. 
RADIATION DOSE REDUCTION: All CT scans are performed using radiation dose 
reduction techniques, when applicable.  Technical factors are evaluated and 
adjusted to ensure appropriate moderation of exposure.  Automated dose 
management technology is applied to adjust the radiation doses to minimize 
exposure while achieving diagnostic quality images.

## 2023-01-05 IMAGING — DX CHEST PA AND LATERAL
2 series · 2 of 2 positions shown · non-contrast
Comparison: CT from 11/18/2022

________________________________________________________________________________________________ 
CLINICAL INDICATION: Left-sided pneumonia..

[PA]
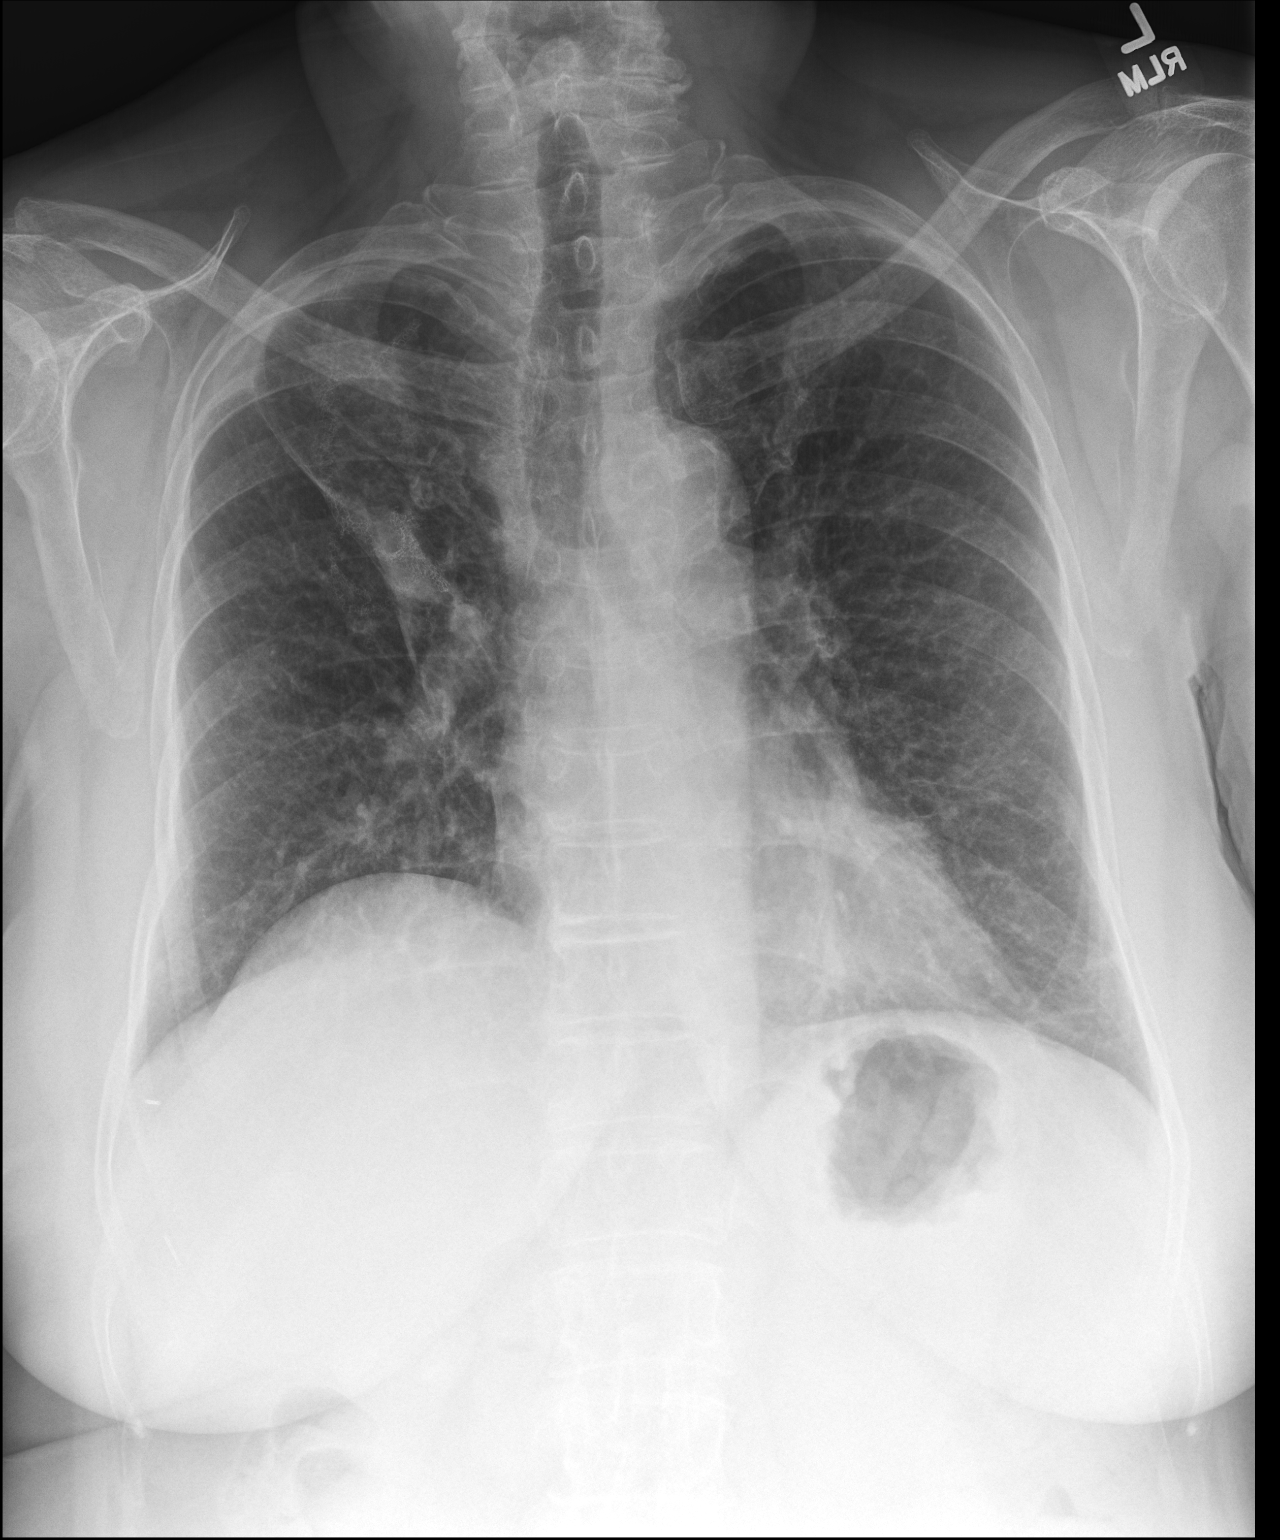

[lateral]
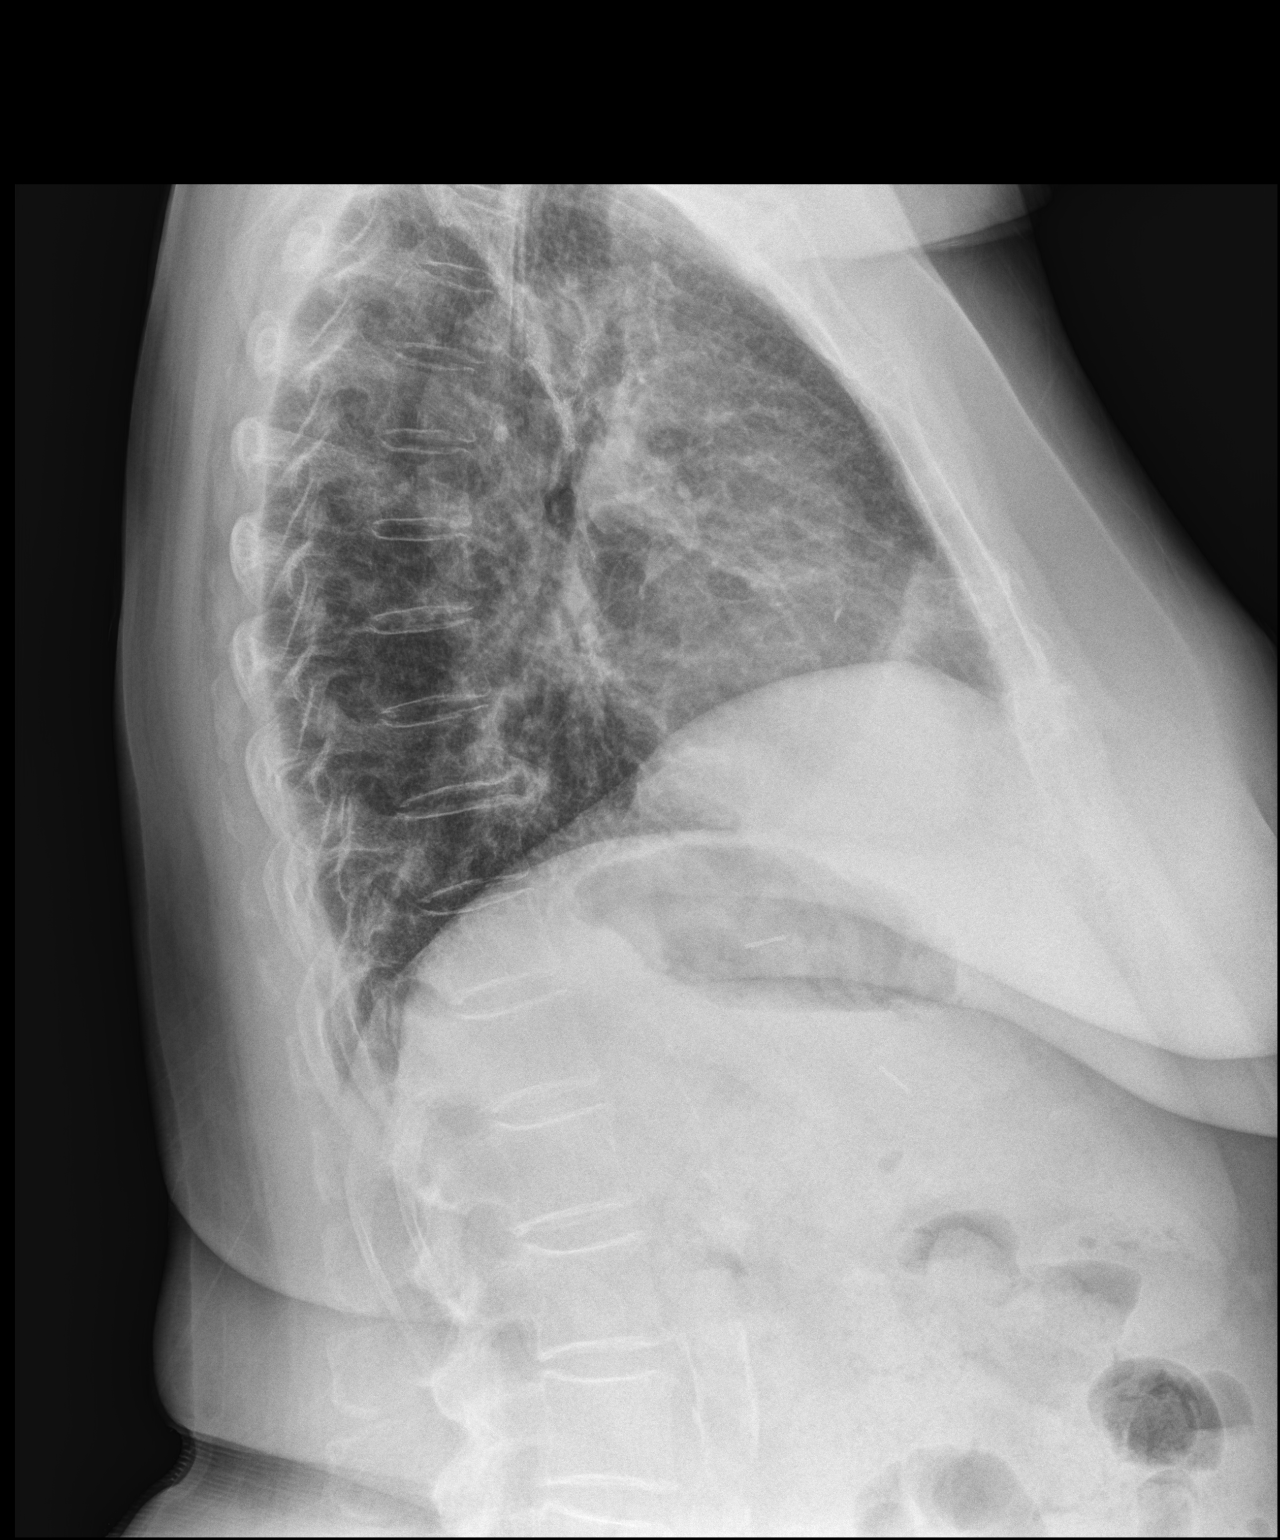

[2 of 2 positions shown; findings below may reference images not displayed]

FINDINGS: Stable post therapy changes in the right upper chest. Some 
peribronchial thickening and mild atelectasis again best seen in the left lower 
lobe. No new consolidation. No effusion. Normal cardiac size and pulmonary 
vascularity. No pneumothorax. No acute osseous abnormality. Mild degenerative 
changes in the lower thoracic spine.
IMPRESSION: Stable post therapy changes in the right upper chest. Some peribronchial 
thickening and mild atelectasis again best seen in the left lower lobe. No new 
consolidation.

## 2023-03-23 IMAGING — MG MAMMOGRAPHY SCREENING BILATERAL 3[PERSON_NAME]
8 series · 8 of 24 positions shown · non-contrast
Comparison: Comparison was made to prior examinations.

________________________________________________________________________________________________ 
MAMMOGRAPHY SCREENING BILATERAL 3LAUREEN HYPPOLITE, 03/23/2023 [DATE]: 
CLINICAL INDICATION: Encounter for screening mammogram.
TECHNIQUE: Digital bilateral mammograms and 3-D Tomosynthesis were obtained. 
These were interpreted both primarily and with the aid of computer-aided 
detection system.  
BREAST DENSITY: (Level B) There are scattered areas of fibroglandular density.

[L MLO]
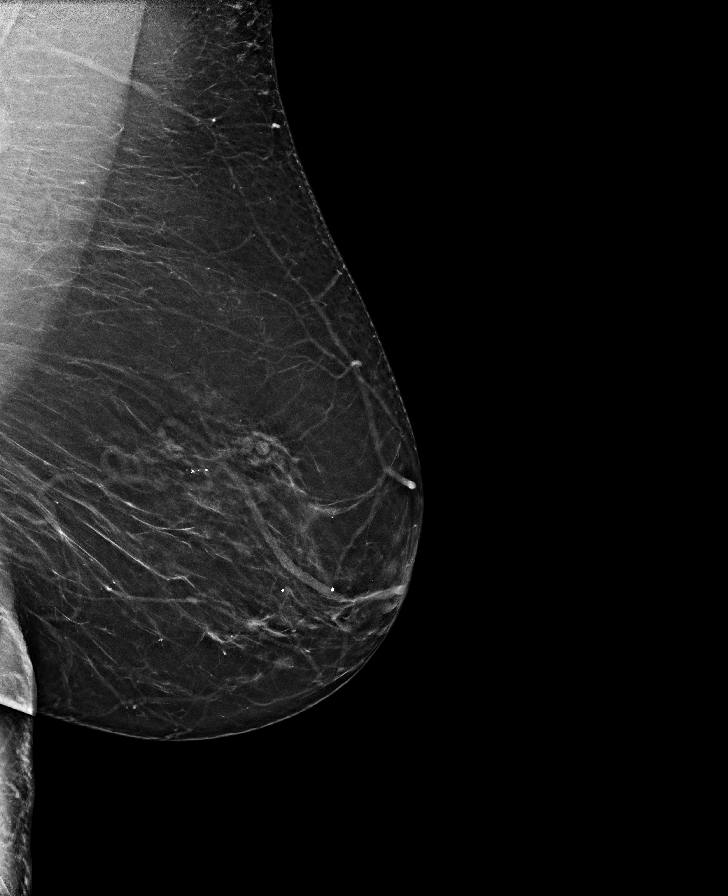

[L CC]
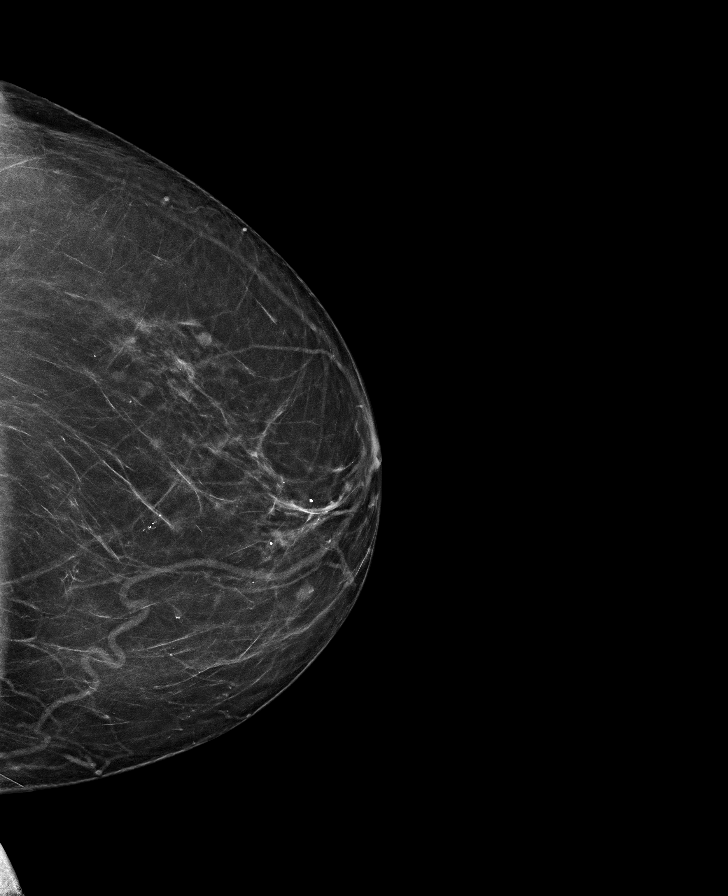

[R MLO]
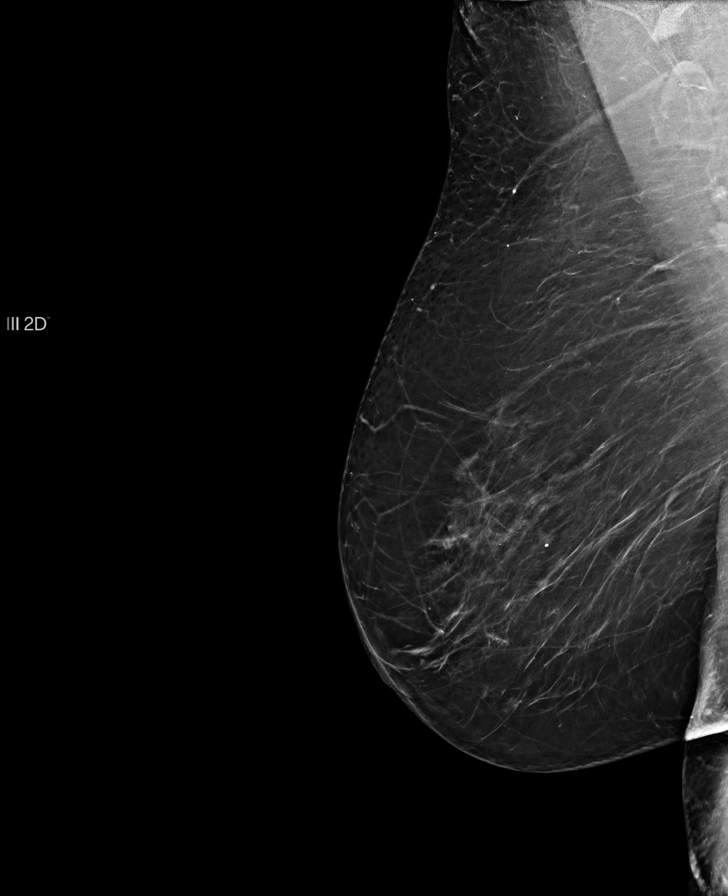

[R CC]
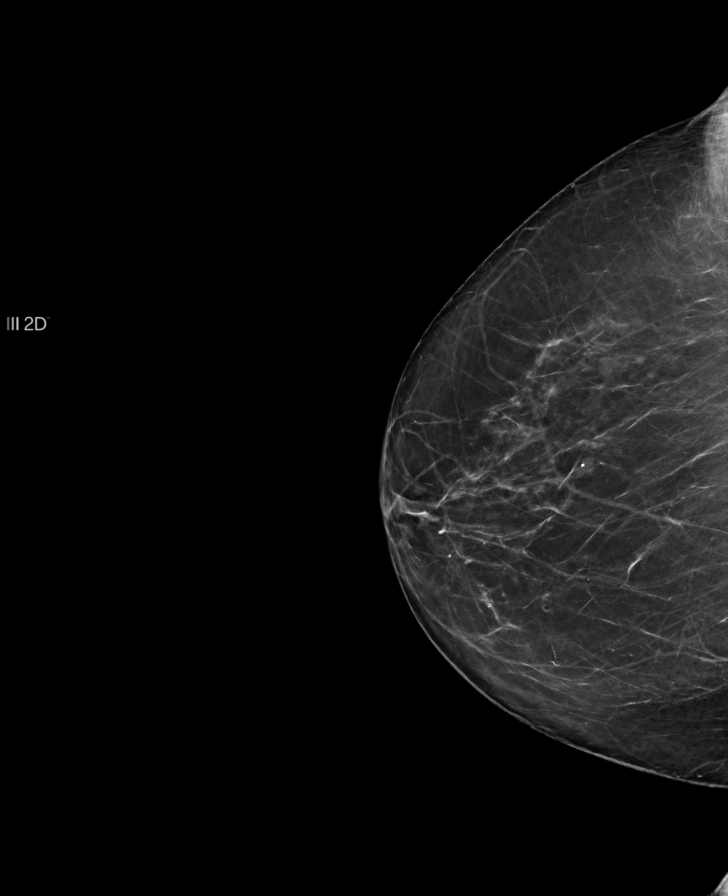

[R CC tomo · tomo slice 12/23.0]
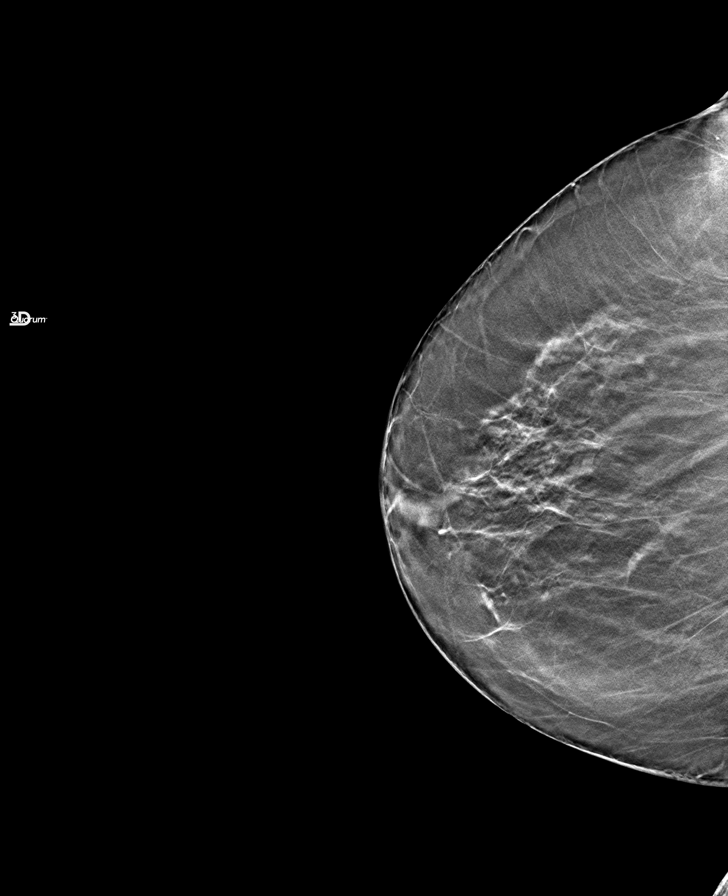

[R MLO tomo · tomo slice 15/29.0]
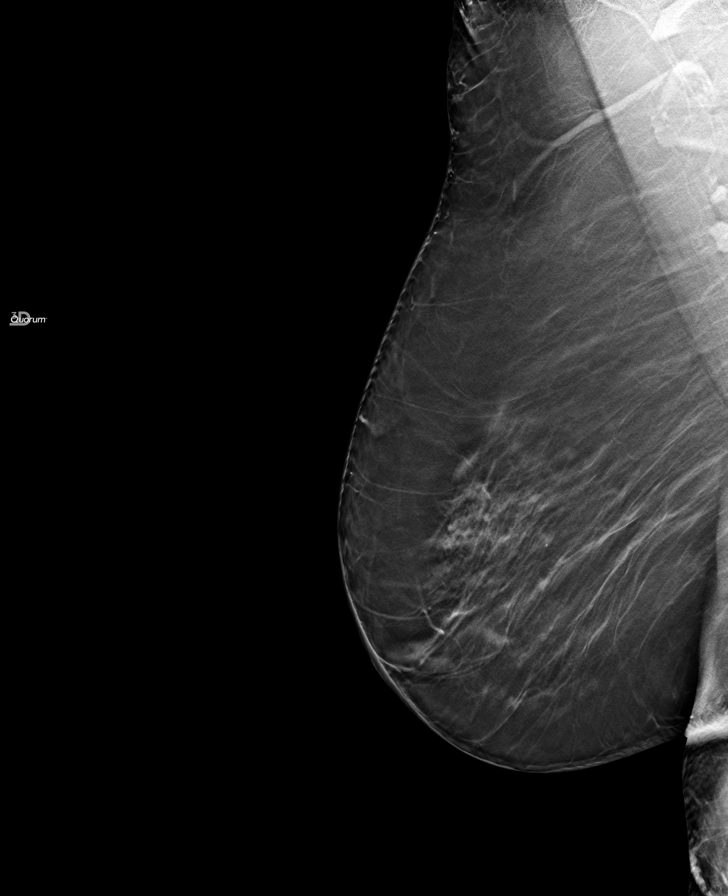

[L MLO tomo · tomo slice 13/26.0]
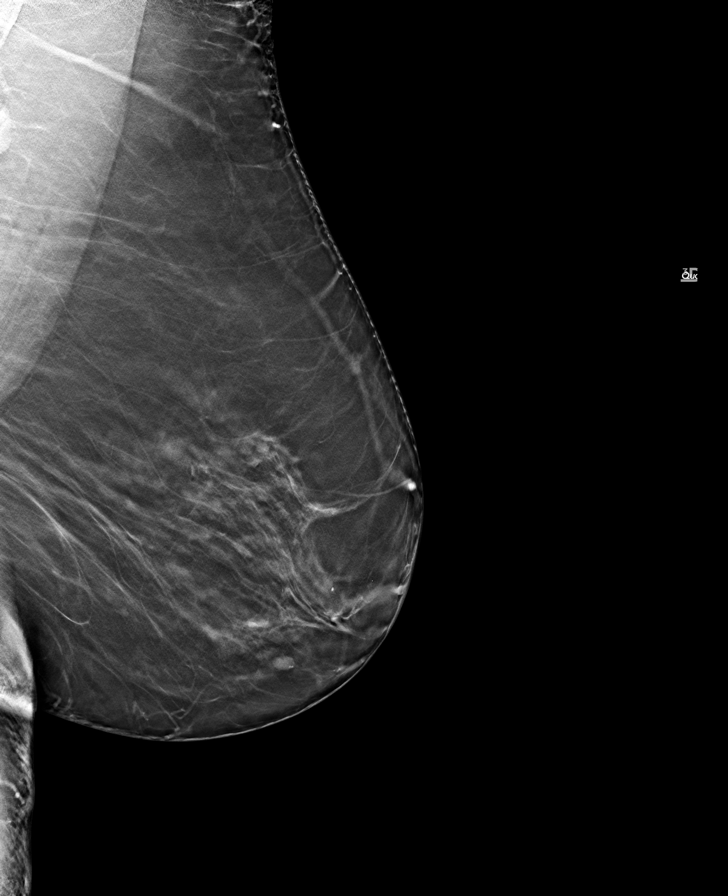

[L CC tomo · tomo slice 12/23.0]
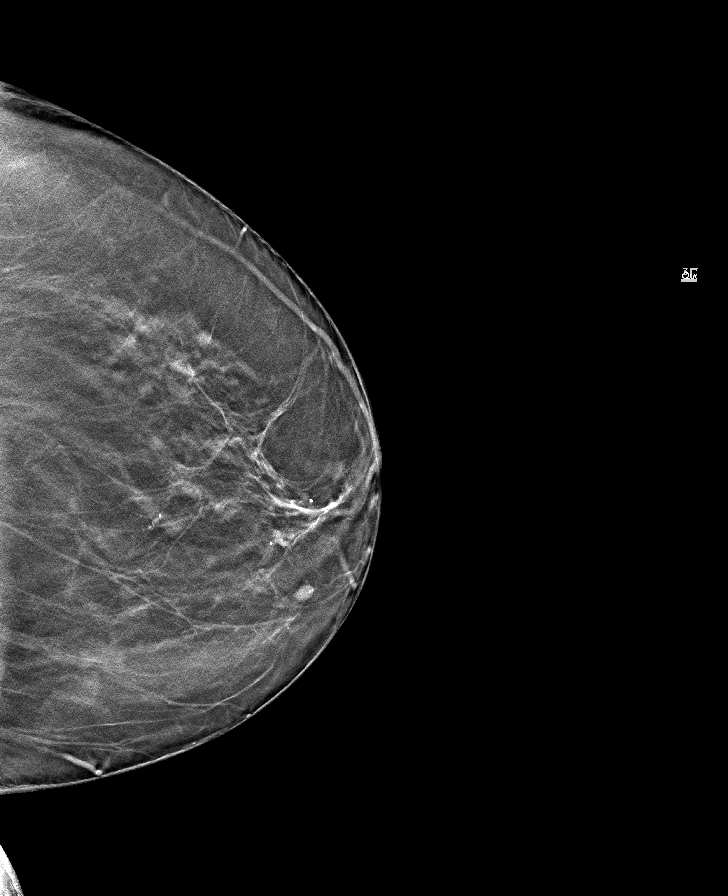

[8 of 24 positions shown; findings below may reference images not displayed]

FINDINGS: No suspicious mass, calcifications, or area of architectural 
distortion in either breast.
IMPRESSION: Stable mammogram. 
(BI-RADS 2) Benign findings. Routine mammographic follow-up is recommended.

## 2023-05-28 IMAGING — CT CT CHEST WITHOUT CONTRAST
2 of 4 series · 14 of 36 positions shown, 17 images · non-contrast
Comparison: CT chest 11/18/22.

________________________________________________________________________________________________ 
CT CHEST WITHOUT CONTRAST, 05/28/2023 [DATE]: 
CLINICAL INDICATION: Malignant neoplasm of unsp part of right bronchus or lung 
A search for DICOM formatted images was conducted for prior CT imaging studies 
completed at a non-affiliated media free facility.
TECHNIQUE: The chest was scanned from base of neck through the lung bases 
without contrast on a high resolution low dose CT scanner. Routine MPR and MIP 
reconstruction images were performed.

[Series 2: chest 2.0 i31s 3 · axial · 0.81mm/px · z∈[-296,-30]mm · 11 of 148 slices shown, 14 images]
[im 8/148  mediastinal]
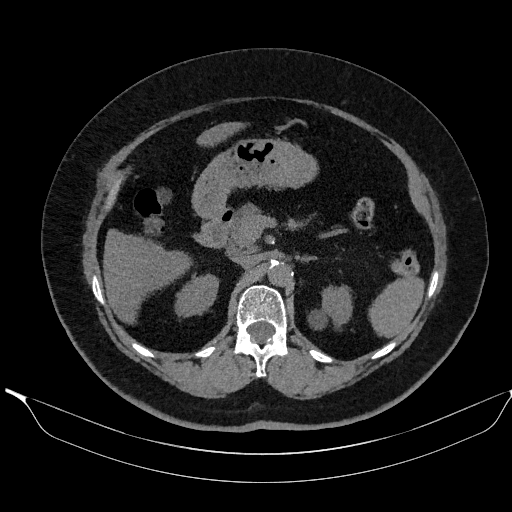
[im 8/148  lung]
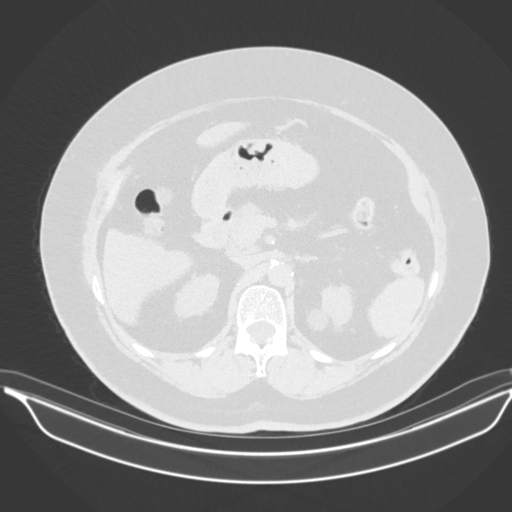
[im 22/148  lung]
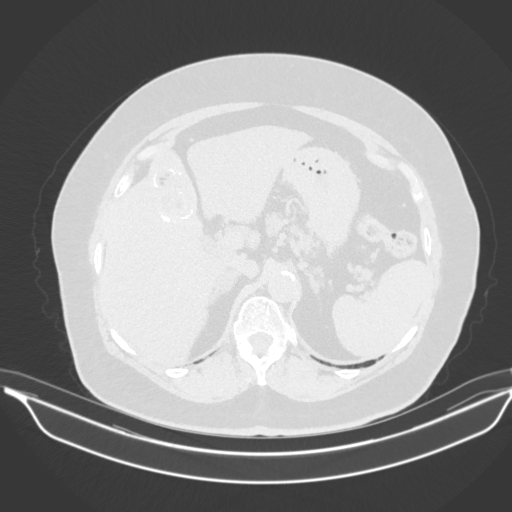
[im 36/148  lung]
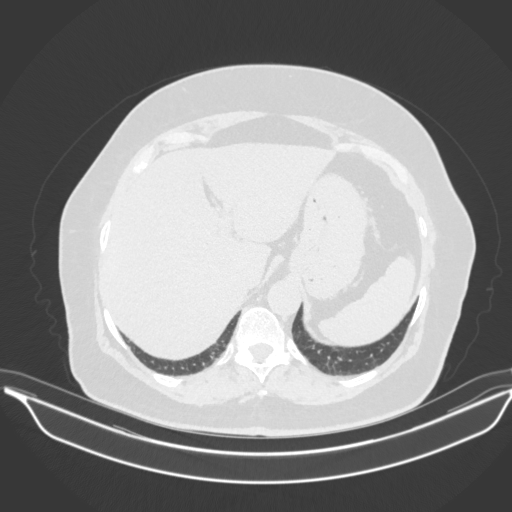
[im 50/148  lung]
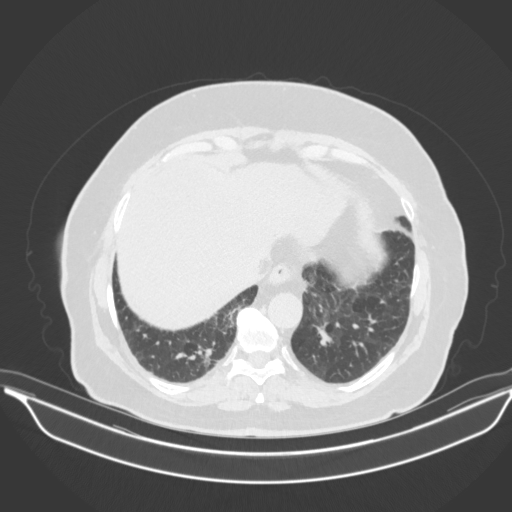
[im 64/148  mediastinal]
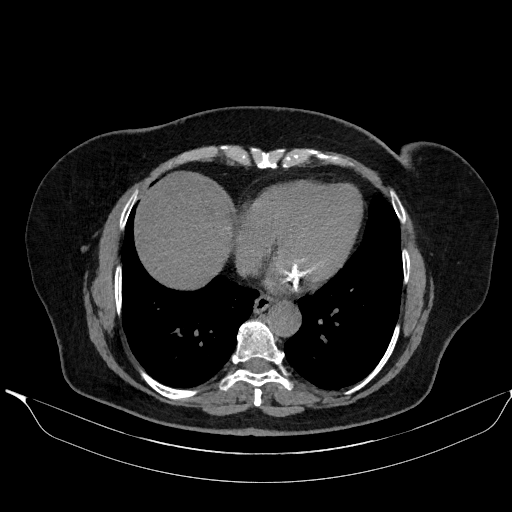
[im 64/148  lung]
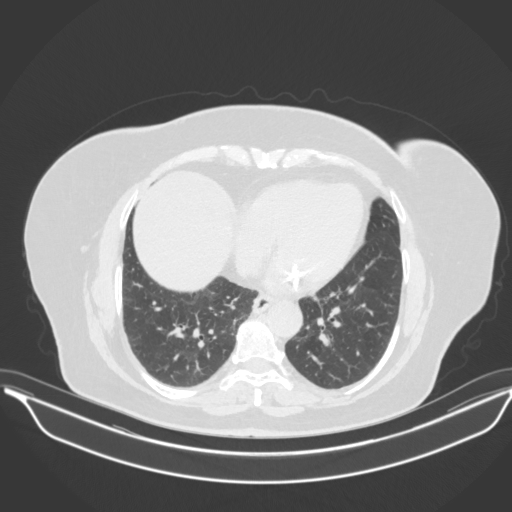
[im 78/148  lung]
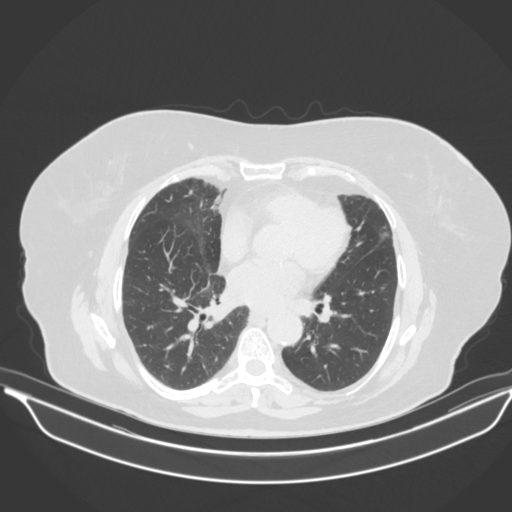
[im 85/148  lung]
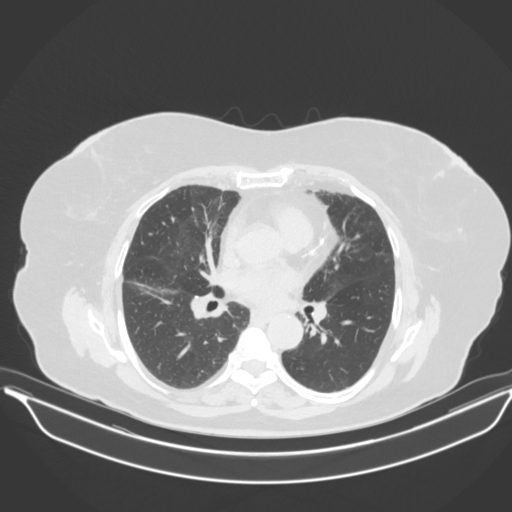
[im 99/148  lung]
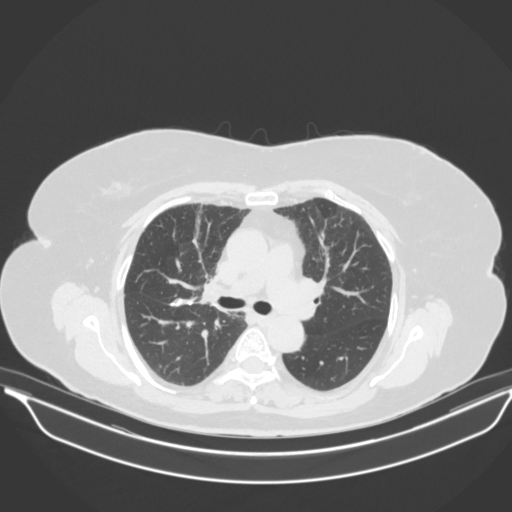
[im 113/148  mediastinal]
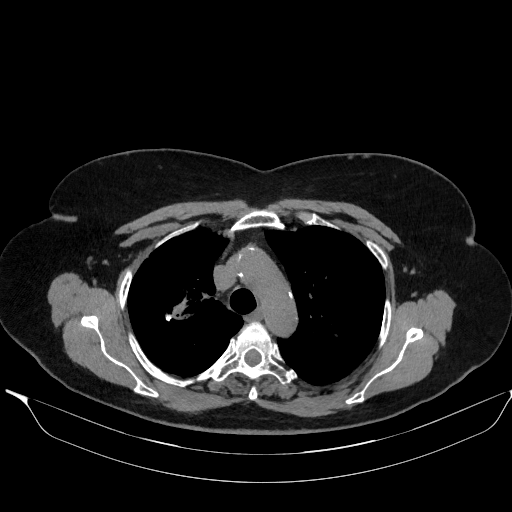
[im 113/148  lung]
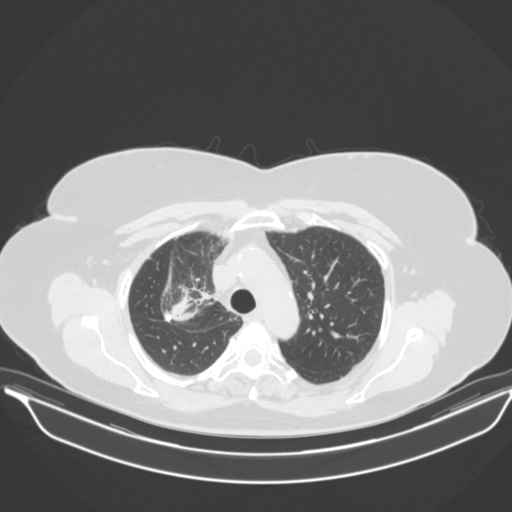
[im 127/148  lung]
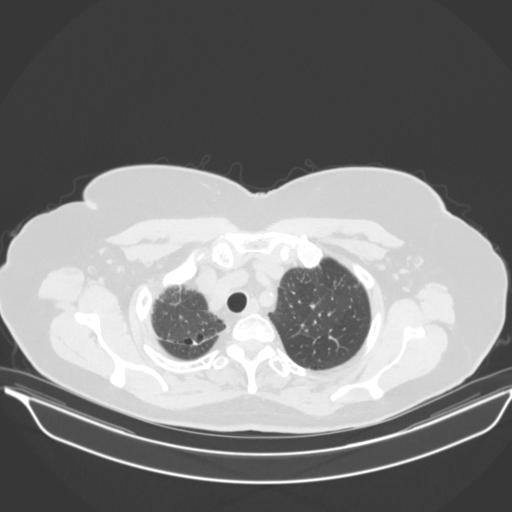
[im 141/148  lung]
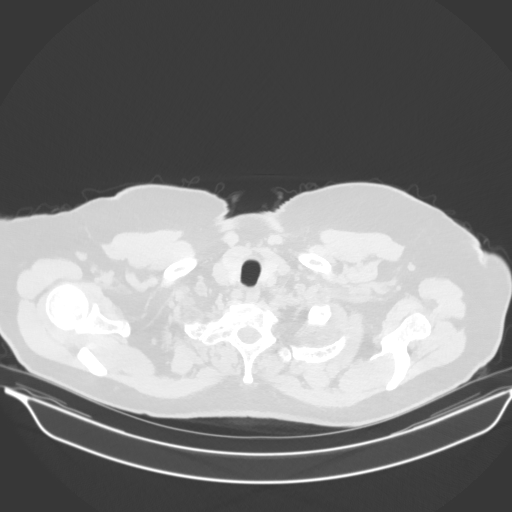

[Series 4: coronal · coronal · 0.59mm/px · 3 of 148 slices shown]
[im 30/148  lung]
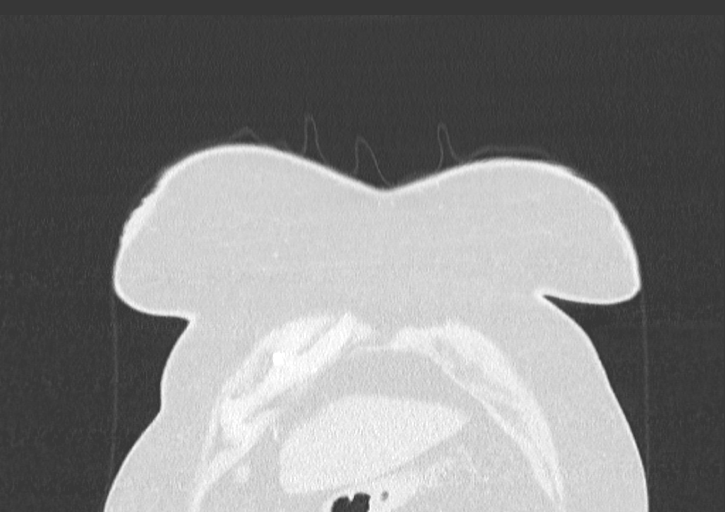
[im 59/148  lung]
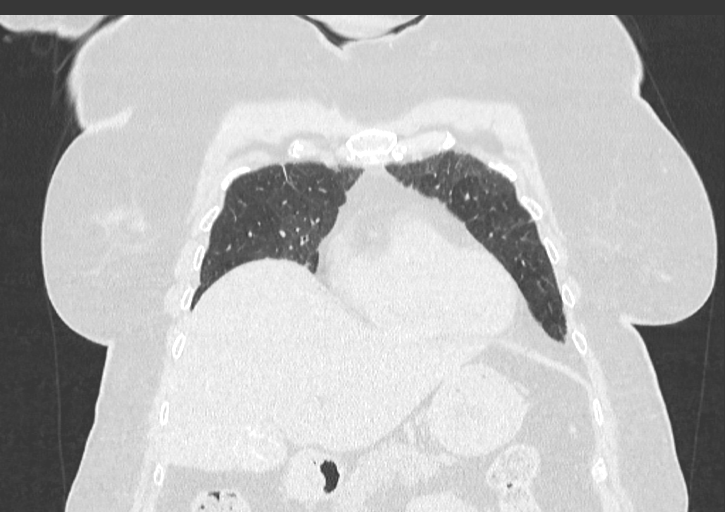
[im 89/148  lung]
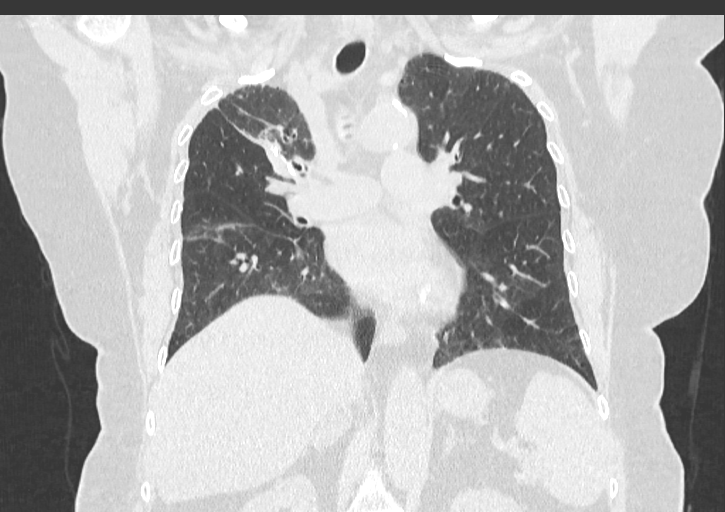

[14 of 36 positions shown; findings below may reference images not displayed]

Count of known CT and Cardiac Nuclear Medicine studies performed in the previous 
12 months = 2.
FINDINGS: LUNGS AND PLEURA:  Post therapy change right upper lobe. Peribronchial 
thickening with scattered atelectasis but no large area consolidation or 
effusion. Scattered calcified and noncalcified micronodules largest measuring 3 
mm and best seen in the left upper lobe on image 16/41. No new suspicious 
pulmonary nodule  
MEDIASTINUM:  No adenopathy. Normal heart size. No pericardial effusion. 
Moderate coronary artery calcifications noted. 
CHEST WALL/AXILLA: No mass or adenopathy.  
UPPER ABDOMEN: Small hiatal hernia. Cholelithiasis. Renal cysts. Stable 1.9 cm 
low-attenuation nodule right adrenal gland. 
MUSCULOSKELETAL: No acute abnormality. Degenerative change.
IMPRESSION: Peribronchial thickening with scattered atelectasis but no acute consolidation 
or effusion. 
Post therapy change right upper lobe. 
Scattered calcified and noncalcified micronodules. No new suspicious pulmonary 
nodule.  
No mediastinal or hilar adenopathy. 
In patients between the ages of 50-77 where pulmonary emphysema is noted on CT, 
recommend evaluation for low dose lung cancer screening protocol if patient is 
not already enrolled; as pulmonary emphysema is an independent risk factor for 
lung cancer. 
RADIATION DOSE REDUCTION: All CT scans are performed using radiation dose 
reduction techniques, when applicable.  Technical factors are evaluated and 
adjusted to ensure appropriate moderation of exposure.  Automated dose 
management technology is applied to adjust the radiation doses to minimize 
exposure while achieving diagnostic quality images.
